# Patient Record
Sex: Male | Born: 1995 | Race: White | Hispanic: No | Marital: Single | Smoking: Current every day smoker
Health system: Southern US, Community
[De-identification: ages and names within clinical notes are randomized; demographics above are authoritative.]

## PROBLEM LIST (undated history)

## (undated) DIAGNOSIS — T7840XA Allergy, unspecified, initial encounter: Secondary | ICD-10-CM

## (undated) DIAGNOSIS — J45909 Unspecified asthma, uncomplicated: Secondary | ICD-10-CM

## (undated) HISTORY — DX: Allergy, unspecified, initial encounter: T78.40XA

## (undated) HISTORY — DX: Unspecified asthma, uncomplicated: J45.909

## (undated) NOTE — ED Provider Notes (Signed)
 Formatting of this note is different from the original. Emergency Department Provider Note.  Enc: 09/16/2024 10:31 AM Timothy Figueroa MRN: [id number] Acct# [id number] [facility] EMERGENCY  HPI   Timothy Figueroa is a 37 y.o. male who presents to the ED with CC: Back Pain  4 year old male presents with mother reporting back pain over the last day after going for a run pain is worse palpation flexion-extension took ibuprofen  without relief.  Denies any fevers chills urinary or stool incontinence saddle paresthesias or focal weakness urgency frequency.  Hematuria  REVIEW OF SYSTEMS   See HPI for pertinent positives and negatives.  PMH  PMH:  Past Medical History:  Diagnosis Date   Bipolar 1 disorder (CMS/HCC)    No past surgical history on file. Previous Medications   HYDROXYZINE  (ATARAX ) 25 MG TABLET    Take 1 tablet (25 mg total) by mouth every 8 (eight) hours as needed for itching   RISPERIDONE  (RISPERDAL ) 3 MG TABLET    Take 1 tablet (3 mg total) by mouth 2 (two) times a day   Social:  Social History   Tobacco Use   Smoking status: Never  Substance Use Topics   Alcohol use: Yes   Drug use: Not Currently   Family: No family history on file. Allergies: He is allergic to penicillins and sulfa antibiotics.  PHYSICAL EXAMINATION   Vital signs:Temperature: 98.8 F (37.1 C) Heart Rate: 94 Resp: 18 BP: 127/80 SpO2: 99 %  Const:  No acute distress Head:  Atraumatic Eyes:  Normal Conjunctiva ENT:  Normal External Ears, Nose and Mouth. [Moist mucous membranes] Neck:  Full range of motion.  No meningismus Resp:  Clear to auscultation bilaterally. Normal work of breathing Cardio:  Regular rate and rhythm, no murmurs. Skin well perfused Abd:  Soft, non-tender, non-distended. Normal bowel sounds. No rebound or guarding Skin:  No petechiae or rashes. Warm and dry Back:   Skin:  No bruising or rash Compartments:  Soft Motor:  Normal flexion and extension of bilateral  hip/knee/ankle/foot Sensation:  Intact to light touch throughout Bones:  No midline TTP  Ext:  No cyanosis, or edema Neuro:  Awake and alert Psych:  Normal Mood and Affect    Data  Lab results: Labs Reviewed - No data to display   X-RAY LS-SPINE 3V INTERPRETED BY ME:  Bones: No fracture Joints: No dislocation Foreign body: None Impression: Normal Lumbar Spine X-Ray  COURSE/MEDICAL DECISION MAKING  Course: Timothy Figueroa presented to the Emergency Department by walk-in for evaluation.  I reviewed the nursing notes.    Medical Decision Making:   Nursing Notes Reviewed Previous Medical Records requested via [facility]: Reviewed by me.  ________________________________________  EMERGENCY DEPARTMENT COURSE/ MEDICAL DECISION MAKING:  I examined the patient, evaluated and addressed patient's chief complaint. The patient was treated with Toradol dexamethasone   Differential Diagnosis:  Sprain strain spinal fracture, epidural hematoma, epidural abscess, unstable spinal pathology, emergent renal or aortic pathology, or spinal cord compression  Comorbidities impacting treatment:  Elevated BMI increased associated morbidity  Social Determinants of health that impact treatment or disposition: Poor access to follow up  MDM Data  Independent historian: yes mother bedside no associated fever  External documents reviewed: Personally reviewed ED visits to [facility] in 2025 labs imaging clinical reports reviewed  My X-ray interpretation: see above, personally interpreted no dislocation and agree w/ radiologist  My labwork interpretation:see above  Test considered but not ordered:  MRI however the patient has no  associated cord syndrome that is the  Discussion with: Personally discussed with family in regards to evaluation, treatment and disposition  Treatment and disposition  Prescription drug management considered:antibiotics, however no signs of bacterial  infection  Hospitalization consideredno  Shared decision making:yes  Code status: full code  MDM This is an otherwise healthy, well-appearing patient presenting with back pain. Patients does not have any high-risk features on history [Trauma, IVDA, cancer, significant weight loss or history of TB], and the patient has a normal neurologic exam without [fever, severe or progressive neurologic deficits, new or worsening urinary retention, urinary/stool incontinence or decreased perineal sensation]; therefore imaging was not indicated in the ED.   I doubt spinal fracture, epidural hematoma, epidural abscess, unstable spinal pathology, emergent renal or aortic pathology, or spinal cord compression.   Upon discharge, the patients pain was controlled, and they had improved mobility. I recommended both non-pharmacologic and non-opioid pharmacologic therapies as the patient does not have active cancer, nor do they currently qualify for palliative or end of life care.    Patient is aware that the purpose of this visit was to screen for an acute medical emergency requiring emergent stabilization. Chronic conditions, including malignancies, have not been ruled out. Patient is instructed to follow-up with a PCP and/or orthopedic surgeon as directed in discharge instructions for continued care and work-up.   Return precautions were discussed including worsening pain, new/worsening weakness/numbness, difficulty urinating, incontinence, or any other worsening/concerning symptoms.   If unable to arrange follow-up, they have been instructed to return to the ED for reassessment. Patient was given verbal and written discharge instructions and acknowledges understanding  On re-assessment, patient feels much better.   The patient understands that todays Emergency Department evaluation does not represent a comprehensive medical workup, and it is impossible to diagnose all possible illnesses from a single Emergency  Department visit. The patient verbalized understanding that it is absolutely necessary to have follow-up with regular primary care physician within 1-2 days for more detailed workup and continued exam. I explained the findings and plan to the patient, who expressed verbal understanding and agreed with plan for discharge and follow up. The patient was given after care instructions and welcomed to return to the ED for re-evaluation in 8-12 hours, especially for any new or worsening symptoms.  Patient's blood pressure was elevated (>120/80) but appears stable without evidence of end organ damage, malignant hypertension, hypertensive emergency or urgency.  The patient was counseled about the risks of hypertension and urged to pursue outpatient monitoring and therapy within a week with their primary care physician.  The patient was stable at the time of discharge.  This documentation was completed using Dragon microphone; therefore, please excuse any typos or misinterpretation of words that may sound similar to those intended.  Return to the Emergency Department for new or worsening symptoms. Final diagnoses:  Acute back pain   New Prescriptions   CYCLOBENZAPRINE (FLEXERIL) 10 MG TABLET    Take one tab qhs prn   IBUPROFEN  (ADVIL ,MOTRIN ) 600 MG TABLET    Take 1 tablet (600 mg total) by mouth every 6 (six) hours as needed for mild pain (1-3) for up to 15 doses      Diagnosis: 1. Acute back pain    Disposition:    Discharge  Portions of this chart may have been created with Dragon voice recognition software. Occasional wrong-word or ?sound-alike? substitutions may have occurred due to the inherent limitations of voice recognition software. Please read the chart carefully and  recognize, using context, where these substitutions have occurred.    Thao A Do, DO 09/16/24 1143  Electronically signed by Jaynie LABOR Do, DO at 09/16/2024 11:43 AM PST

## (undated) NOTE — ED Notes (Signed)
 Formatting of this note might be different from the original. D/C paperwork prepared and reviewed with pt regarding diagnosis: Back Pain. D/C instructions to F/U with PCP and report worsening symptoms with pt. RX sent to pharmacy of choice. Verbalized understanding and ambulated out with steady gait with all belongings.  Electronically signed by Mike Patch, RN at 09/16/2024 11:41 AM PST

## (undated) NOTE — ED Notes (Signed)
 Formatting of this note might be different from the original. PT BEING TAKEN TO RADIOLOGY Electronically signed by Mike Patch, RN at 09/16/2024 10:57 AM PST

## (undated) NOTE — Unmapped External Note (Signed)
 Formatting of this note might be different from the original. PT C/O RT SIDE MID BACK PAIN WITH RADIATION TO BILAT LEGS STS STARTED WHILE WAS RUNNING YESTERDAY. Electronically signed by Maryjane Ebbs, RN at 09/16/2024 10:28 AM PST

---
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
## 2014-10-27 ENCOUNTER — Ambulatory Visit (INDEPENDENT_AMBULATORY_CARE_PROVIDER_SITE_OTHER): Payer: BLUE CROSS/BLUE SHIELD

## 2014-10-27 ENCOUNTER — Ambulatory Visit (INDEPENDENT_AMBULATORY_CARE_PROVIDER_SITE_OTHER): Payer: BLUE CROSS/BLUE SHIELD | Admitting: Internal Medicine

## 2014-10-27 VITALS — BP 92/60 | HR 61 | Temp 97.6°F | Resp 20 | Ht 71.5 in | Wt 206.5 lb

## 2014-10-27 DIAGNOSIS — S93401A Sprain of unspecified ligament of right ankle, initial encounter: Secondary | ICD-10-CM

## 2014-10-27 DIAGNOSIS — M25571 Pain in right ankle and joints of right foot: Secondary | ICD-10-CM

## 2014-10-27 NOTE — Patient Instructions (Signed)

## 2014-10-27 NOTE — Progress Notes (Signed)
   Subjective:    Patient ID: Timothy Figueroa, male    DOB: 11-Jul-1996, 19 y.o.   MRN: [id number]  HPI Timothy Figueroa is a 19yo male new patient who presents with acute onset ankle pain. Location of pain is anterolateral right ankle and posterior portion of the distal right malleolus. He describes that he is walking his dog and suddenly fell in a hole, causing him to invert his right ankle. He says he fell to the ground after this and noticed immediate pain and swelling soon after. He was able to bear weight following the injury. He has taken ibuprofen 400 mg twice today with some relief in symptoms. Symptoms are aggravated with walking. Symptoms are relieved with medication and rest. He denies feeling any pop in the ankle. He denies any numbness, tingling, or weakness.  Past medical history, social history, medications, and allergies were reviewed and are up to date in the chart.  Allegies: PCN and Sulfa  Review of Systems 7 point review of systems was performed and was otherwise negative unless noted in the history of present illness.     Objective:   Physical Exam BP 92/60 mmHg  Pulse 61  Temp(Src) 97.6 F (36.4 C) (Oral)  Resp 20  Ht 5' 11.5" (1.816 m)  Wt 206 lb 8 oz (93.668 kg)  BMI 28.40 kg/m2  SpO2 98% GEN: The patient is well-developed well-nourished male and in no acute distress.  He is awake alert and oriented x3. SKIN: warm and well-perfused, no rash  EXTR: No lower extremity edema or calf tenderness Neuro: Strength 5/5 globally. Sensation intact throughout. No focal deficits. Vasc: +2 bilateral distal pulses. No edema.  MSK: Examination of the right ankle reveals full plantar and dorsiflexion with intact strength. He has tenderness to palpation over the ATFL. He has some localized swelling over the ATFL. He has minimal bruising and no ecchymoses. His peroneal and posterior tibialis tendons feel intact. He is also tender to palpation over the posterior distal third of the right  lateral malleolus. He has no tenderness at the base of the fifth metatarsal, navicular, or posterior portion of the medial malleolus. Negative Klieger test. Negative ankle squeeze test. Positive talar tilt test. He is neurovascularly intact distally.  X-rays Right Ankle: Preliminary read by Dr. Joellyn HaffPick-Jacobs, Sports Medicine Fellow: Normal bony alignment. No widening of ankle mortise. No acute fracture seen.     Assessment & Plan:   1. Grade I Right Ankle Sprain  -Preliminary X-rays negative for fracture or malalignment. -Rest, ice, elevation, NSAID as directed if needed -Ankle rehabilitation exercises given to work on strength and proprioception, early mobilization as symptoms permit. -Follow-up if needed.  Dr. Joellyn HaffPick-Jacobs, DO Sports Medicine Fellow  Discussed with Dr. Merla Richesoolittle.  Addendum: Final Radiologist Read: IMPRESSION: Apparent small osseous fragment adjacent to the distal fibula may reflect remote injury or possibly acute avulsion injury. No additional evidence of fracture.  I again reviewed the x-ray with Dr. Merla Richesoolittle, who agrees that the finding by the radiologist appears to be from an old injury. This would not change our current management of the patient's condition at this time. Follow-up as needed as before. If pain persists, would consider placing patient in a CAM walker boot.  Dr. Joellyn HaffPick-Jacobs, DO Sports Medicine Fellow Discussed with Dr. Merla Richesoolittle.

## 2015-02-06 ENCOUNTER — Emergency Department (HOSPITAL_COMMUNITY): Payer: BLUE CROSS/BLUE SHIELD

## 2015-02-06 ENCOUNTER — Encounter (HOSPITAL_COMMUNITY): Payer: Self-pay

## 2015-02-06 ENCOUNTER — Emergency Department (HOSPITAL_COMMUNITY)
Admission: EM | Admit: 2015-02-06 | Discharge: 2015-02-06 | Disposition: A | Discharge status: Home / Self Care | Payer: BLUE CROSS/BLUE SHIELD | Attending: Emergency Medicine | Admitting: Emergency Medicine

## 2015-02-06 DIAGNOSIS — S161XXA Strain of muscle, fascia and tendon at neck level, initial encounter: Secondary | ICD-10-CM | POA: Diagnosis not present

## 2015-02-06 DIAGNOSIS — Y9365 Activity, lacrosse and field hockey: Secondary | ICD-10-CM | POA: Diagnosis not present

## 2015-02-06 DIAGNOSIS — S060X0A Concussion without loss of consciousness, initial encounter: Secondary | ICD-10-CM | POA: Diagnosis not present

## 2015-02-06 DIAGNOSIS — W500XXA Accidental hit or strike by another person, initial encounter: Secondary | ICD-10-CM | POA: Insufficient documentation

## 2015-02-06 DIAGNOSIS — Y9239 Other specified sports and athletic area as the place of occurrence of the external cause: Secondary | ICD-10-CM | POA: Diagnosis not present

## 2015-02-06 DIAGNOSIS — Y998 Other external cause status: Secondary | ICD-10-CM | POA: Insufficient documentation

## 2015-02-06 DIAGNOSIS — J45909 Unspecified asthma, uncomplicated: Secondary | ICD-10-CM | POA: Diagnosis not present

## 2015-02-06 DIAGNOSIS — Z880 Allergy status to penicillin: Secondary | ICD-10-CM | POA: Diagnosis not present

## 2015-02-06 DIAGNOSIS — S0990XA Unspecified injury of head, initial encounter: Secondary | ICD-10-CM | POA: Diagnosis present

## 2015-02-06 MED ORDER — IBUPROFEN 800 MG PO TABS
800.0000 mg | ORAL_TABLET | Freq: Once | ORAL | Status: AC
  Administered 2015-02-06: 800 mg via ORAL
  Filled 2015-02-06: qty 1

## 2015-02-06 NOTE — Discharge Instructions (Signed)
Concussion A concussion, or closed-head injury, is a brain injury caused by a direct blow to the head or by a quick and sudden movement (jolt) of the head or neck. Concussions are usually not life-threatening. Even so, the effects of a concussion can be serious. If you have had a concussion before, you are more likely to experience concussion-like symptoms after a direct blow to the head.  CAUSES  Direct blow to the head, such as from running into another player during a soccer game, being hit in a fight, or hitting your head on a hard surface.  A jolt of the head or neck that causes the brain to move back and forth inside the skull, such as in a car crash. SIGNS AND SYMPTOMS The signs of a concussion can be hard to notice. Early on, they may be missed by you, family members, and health care providers. You may look fine but act or feel differently. Symptoms are usually temporary, but they may last for days, weeks, or even longer. Some symptoms may appear right away while others may not show up for hours or days. Every head injury is different. Symptoms include:  Mild to moderate headaches that will not go away.  A feeling of pressure inside your head.  Having more trouble than usual:  Learning or remembering things you have heard.  Answering questions.  Paying attention or concentrating.  Organizing daily tasks.  Making decisions and solving problems.  Slowness in thinking, acting or reacting, speaking, or reading.  Getting lost or being easily confused.  Feeling tired all the time or lacking energy (fatigued).  Feeling drowsy.  Sleep disturbances.  Sleeping more than usual.  Sleeping less than usual.  Trouble falling asleep.  Trouble sleeping (insomnia).  Loss of balance or feeling lightheaded or dizzy.  Nausea or vomiting.  Numbness or tingling.  Increased sensitivity to:  Sounds.  Lights.  Distractions.  Vision problems or eyes that tire  easily.  Diminished sense of taste or smell.  Ringing in the ears.  Mood changes such as feeling sad or anxious.  Becoming easily irritated or angry for little or no reason.  Lack of motivation.  Seeing or hearing things other people do not see or hear (hallucinations). DIAGNOSIS Your health care provider can usually diagnose a concussion based on a description of your injury and symptoms. He or she will ask whether you passed out (lost consciousness) and whether you are having trouble remembering events that happened right before and during your injury. Your evaluation might include:  A brain scan to look for signs of injury to the brain. Even if the test shows no injury, you may still have a concussion.  Blood tests to be sure other problems are not present. TREATMENT  Concussions are usually treated in an emergency department, in urgent care, or at a clinic. You may need to stay in the hospital overnight for further treatment.  Tell your health care provider if you are taking any medicines, including prescription medicines, over-the-counter medicines, and natural remedies. Some medicines, such as blood thinners (anticoagulants) and aspirin, may increase the chance of complications. Also tell your health care provider whether you have had alcohol or are taking illegal drugs. This information may affect treatment.  Your health care provider will send you home with important instructions to follow.  How fast you will recover from a concussion depends on many factors. These factors include how severe your concussion is, what part of your brain was injured, your  age, and how healthy you were before the concussion. °· Most people with mild injuries recover fully. Recovery can take time. In general, recovery is slower in older persons. Also, persons who have had a concussion in the past or have other medical problems may find that it takes longer to recover from their current injury. °HOME  CARE INSTRUCTIONS °General Instructions °· Carefully follow the directions your health care provider gave you. °· Only take over-the-counter or prescription medicines for pain, discomfort, or fever as directed by your health care provider. °· Take only those medicines that your health care provider has approved. °· Do not drink alcohol until your health care provider says you are well enough to do so. Alcohol and certain other drugs may slow your recovery and can put you at risk of further injury. °· If it is harder than usual to remember things, write them down. °· If you are easily distracted, try to do one thing at a time. For example, do not try to watch TV while fixing dinner. °· Talk with family members or close friends when making important decisions. °· Keep all follow-up appointments. Repeated evaluation of your symptoms is recommended for your recovery. °· Watch your symptoms and tell others to do the same. Complications sometimes occur after a concussion. Older adults with a brain injury may have a higher risk of serious complications, such as a blood clot on the brain. °· Tell your teachers, school nurse, school counselor, coach, athletic trainer, or work manager about your injury, symptoms, and restrictions. Tell them about what you can or cannot do. They should watch for: °¨ Increased problems with attention or concentration. °¨ Increased difficulty remembering or learning new information. °¨ Increased time needed to complete tasks or assignments. °¨ Increased irritability or decreased ability to cope with stress. °¨ Increased symptoms. °· Rest. Rest helps the brain to heal. Make sure you: °¨ Get plenty of sleep at night. Avoid staying up late at night. °¨ Keep the same bedtime hours on weekends and weekdays. °¨ Rest during the day. Take daytime naps or rest breaks when you feel tired. °· Limit activities that require a lot of thought or concentration. These include: °¨ Doing homework or job-related  work. °¨ Watching TV. °¨ Working on the computer. °· Avoid any situation where there is potential for another head injury (football, hockey, soccer, basketball, martial arts, downhill snow sports and horseback riding). Your condition will get worse every time you experience a concussion. You should avoid these activities until you are evaluated by the appropriate follow-up health care providers. °Returning To Your Regular Activities °You will need to return to your normal activities slowly, not all at once. You must give your body and brain enough time for recovery. °· Do not return to sports or other athletic activities until your health care provider tells you it is safe to do so. °· Ask your health care provider when you can drive, ride a bicycle, or operate heavy machinery. Your ability to react may be slower after a brain injury. Never do these activities if you are dizzy. °· Ask your health care provider about when you can return to work or school. °Preventing Another Concussion °It is very important to avoid another brain injury, especially before you have recovered. In rare cases, another injury can lead to permanent brain damage, brain swelling, or death. The risk of this is greatest during the first 7-10 days after a head injury. Avoid injuries by: °· Wearing a seat   belt when riding in a car.  Drinking alcohol only in moderation.  Wearing a helmet when biking, skiing, skateboarding, skating, or doing similar activities.  Avoiding activities that could lead to a second concussion, such as contact or recreational sports, until your health care provider says it is okay.  Taking safety measures in your home.  Remove clutter and tripping hazards from floors and stairways.  Use grab bars in bathrooms and handrails by stairs.  Place non-slip mats on floors and in bathtubs.  Improve lighting in dim areas. SEEK MEDICAL CARE IF:  You have increased problems paying attention or  concentrating.  You have increased difficulty remembering or learning new information.  You need more time to complete tasks or assignments than before.  You have increased irritability or decreased ability to cope with stress.  You have more symptoms than before. Seek medical care if you have any of the following symptoms for more than 2 weeks after your injury:  Lasting (chronic) headaches.  Dizziness or balance problems.  Nausea.  Vision problems.  Increased sensitivity to noise or light.  Depression or mood swings.  Anxiety or irritability.  Memory problems.  Difficulty concentrating or paying attention.  Sleep problems.  Feeling tired all the time. SEEK IMMEDIATE MEDICAL CARE IF:  You have severe or worsening headaches. These may be a sign of a blood clot in the brain.  You have weakness (even if only in one hand, leg, or part of the face).  You have numbness.  You have decreased coordination.  You vomit repeatedly.  You have increased sleepiness.  One pupil is larger than the other.  You have convulsions.  You have slurred speech.  You have increased confusion. This may be a sign of a blood clot in the brain.  You have increased restlessness, agitation, or irritability.  You are unable to recognize people or places.  You have neck pain.  It is difficult to wake you up.  You have unusual behavior changes.  You lose consciousness. MAKE SURE YOU:  Understand these instructions.  Will watch your condition.  Will get help right away if you are not doing well or get worse. Document Released: 12/30/2003 Document Revised: 10/14/2013 Document Reviewed: 05/01/2013 [facility] Patient Information 2015 [place], [place]. This information is not intended to replace advice given to you by your health care provider. Make sure you discuss any questions you have with your health care provider.  Cervical Sprain A cervical sprain is an injury in the neck in  which the strong, fibrous tissues (ligaments) that connect your neck bones stretch or tear. Cervical sprains can range from mild to severe. Severe cervical sprains can cause the neck vertebrae to be unstable. This can lead to damage of the spinal cord and can result in serious nervous system problems. The amount of time it takes for a cervical sprain to get better depends on the cause and extent of the injury. Most cervical sprains heal in 1 to 3 weeks. CAUSES  Severe cervical sprains may be caused by:   Contact sport injuries (such as from football, rugby, wrestling, hockey, auto racing, gymnastics, diving, martial arts, or boxing).   Motor vehicle collisions.   Whiplash injuries. This is an injury from a sudden forward and backward whipping movement of the head and neck.  Falls.  Mild cervical sprains may be caused by:   Being in an awkward position, such as while cradling a telephone between your ear and shoulder.   Sitting in a  chair that does not offer proper support.   Working at a poorly [employer] executivedesigned computer station.   Looking up or down for long periods of time.  SYMPTOMS   Pain, soreness, stiffness, or a burning sensation in the front, back, or sides of the neck. This discomfort may develop immediately after the injury or slowly, 24 hours or more after the injury.   Pain or tenderness directly in the middle of the back of the neck.   Shoulder or upper back pain.   Limited ability to move the neck.   Headache.   Dizziness.   Weakness, numbness, or tingling in the hands or arms.   Muscle spasms.   Difficulty swallowing or chewing.   Tenderness and swelling of the neck.  DIAGNOSIS  Most of the time your health care provider can diagnose a cervical sprain by taking your history and doing a physical exam. Your health care provider will ask about previous neck injuries and any known neck problems, such as arthritis in the neck. X-rays may be taken to find  out if there are any other problems, such as with the bones of the neck. Other tests, such as a CT scan or MRI, may also be needed.  TREATMENT  Treatment depends on the severity of the cervical sprain. Mild sprains can be treated with rest, keeping the neck in place (immobilization), and pain medicines. Severe cervical sprains are immediately immobilized. Further treatment is done to help with pain, muscle spasms, and other symptoms and may include:  Medicines, such as pain relievers, numbing medicines, or muscle relaxants.   Physical therapy. This may involve stretching exercises, strengthening exercises, and posture training. Exercises and improved posture can help stabilize the neck, strengthen muscles, and help stop symptoms from returning.  HOME CARE INSTRUCTIONS   Put ice on the injured area.   Put ice in a plastic bag.   Place a towel between your skin and the bag.   Leave the ice on for 15-20 minutes, 3-4 times a day.   If your injury was severe, you may have been given a cervical collar to wear. A cervical collar is a two-piece collar designed to keep your neck from moving while it heals.  Do not remove the collar unless instructed by your health care provider.  If you have long hair, keep it outside of the collar.  Ask your health care provider before making any adjustments to your collar. Minor adjustments may be required over time to improve comfort and reduce pressure on your chin or on the back of your head.  Ifyou are allowed to remove the collar for cleaning or bathing, follow your health care provider's instructions on how to do so safely.  Keep your collar clean by wiping it with mild soap and water and drying it completely. If the collar you have been given includes removable pads, remove them every 1-2 days and hand wash them with soap and water. Allow them to air dry. They should be completely dry before you wear them in the collar.  If you are allowed to  remove the collar for cleaning and bathing, wash and dry the skin of your neck. Check your skin for irritation or sores. If you see any, tell your health care provider.  Do not drive while wearing the collar.   Only take over-the-counter or prescription medicines for pain, discomfort, or fever as directed by your health care provider.   Keep all follow-up appointments as directed by your health care  provider.   Keep all physical therapy appointments as directed by your health care provider.   Make any needed adjustments to your workstation to promote good posture.   Avoid positions and activities that make your symptoms worse.   Warm up and stretch before being active to help prevent problems.  SEEK MEDICAL CARE IF:   Your pain is not controlled with medicine.   You are unable to decrease your pain medicine over time as planned.   Your activity level is not improving as expected.  SEEK IMMEDIATE MEDICAL CARE IF:   You develop any bleeding.  You develop stomach upset.  You have signs of an allergic reaction to your medicine.   Your symptoms get worse.   You develop new, unexplained symptoms.   You have numbness, tingling, weakness, or paralysis in any part of your body.  MAKE SURE YOU:   Understand these instructions.  Will watch your condition.  Will get help right away if you are not doing well or get worse. Document Released: 08/06/2007 Document Revised: 10/14/2013 Document Reviewed: 04/16/2013 [facility] Patient Information 2015 [place], [place]. This information is not intended to replace advice given to you by your health care provider. Make sure you discuss any questions you have with your health care provider.

## 2015-02-06 NOTE — ED Provider Notes (Signed)
CSN: [id number]     Arrival date & time 02/06/15  1815 History   First MD Initiated Contact with Patient 02/06/15 2008     Chief Complaint  Patient presents with  . Head Injury    Patient is a 19 y.o. male presenting with head injury. The history is provided by the patient. No language interpreter was used.  Head Injury  Mr. Innocent presents for evaluation of head injury. He was playing lacrosse around 5 PM. He was going for a ball and struck by another player. He was wearing a helmet. He is not amnestic to the event and has no loss of consciousness and no vomiting. Reports a mild headache rated 2-3 out of 10. He had nausea for about 10 minutes and felt not like himself for a while. This is since resolved. He does have some pain throughout his neck. He does have some chronic neck pain but it is more intense than usual. He denies any numbness, weakness, vomiting. He has no medical problems and takes no medications. Symptoms are moderate, constant.  Past Medical History  Diagnosis Date  . Allergy   . Asthma    History reviewed. No pertinent past surgical history. Family History  Problem Relation Age of Onset  . Hypertension Father   . Cancer Paternal Grandfather    History  Substance Use Topics  . Smoking status: Never Smoker   . Smokeless tobacco: Current User    Types: Chew  . Alcohol Use: No    Review of Systems  All other systems reviewed and are negative.     Allergies  Penicillins and Sulfa antibiotics  Home Medications   Prior to Admission medications   Not on File   BP 115/57 mmHg  Pulse 64  Temp(Src) 97.9 F (36.6 C)  Resp 20  Ht 6' (1.829 m)  Wt 206 lb 8 oz (93.668 kg)  BMI 28.00 kg/m2  SpO2 97% Physical Exam  Constitutional: He is oriented to person, place, and time. He appears well-developed and well-nourished.  HENT:  Head: Normocephalic and atraumatic.  Right Ear: External ear normal.  Left Ear: External ear normal.  Mouth/Throat: Oropharynx is  clear and moist.  Eyes: EOM are normal. Pupils are equal, round, and reactive to light.  Neck:  Mild tenderness to palpation over lower c spine.    Cardiovascular: Normal rate and regular rhythm.   No murmur heard. Pulmonary/Chest: Effort normal and breath sounds normal. No respiratory distress.  Abdominal: Soft. There is no tenderness. There is no rebound and no guarding.  Musculoskeletal: He exhibits no edema or tenderness.  Neurological: He is alert and oriented to person, place, and time. No cranial nerve deficit. Coordination normal.  Skin: Skin is warm and dry.  Psychiatric: He has a normal mood and affect. His behavior is normal.  Nursing note and vitals reviewed.   ED Course  Procedures (including critical care time) Labs Review Labs Reviewed - No data to display  Imaging Review Dg Cervical Spine Complete  02/06/2015   CLINICAL DATA:  Head injury. Hyperextended neck while playing lacrosse, now with left-sided neck pain radiating into the back.  EXAM: CERVICAL SPINE  4+ VIEWS  COMPARISON:  None.  FINDINGS: Cervical spine alignment is maintained. Vertebral body heights and intervertebral disc spaces are preserved. The dens is intact. Posterior elements appear well-aligned. There is no evidence of fracture. No prevertebral soft tissue edema.  IMPRESSION: No acute bony abnormality of the cervical spine. No fracture or subluxation.  Electronically Signed   By: Rubye OaksMelanie  Ehinger M.D.   On: 02/06/2015 21:05     EKG Interpretation None      MDM   Final diagnoses:  Concussion, without loss of consciousness, initial encounter  Cervical strain, initial encounter    Patient here for evaluation of neck discomfort and headache following head injury. Patient is neurologically intact, GCS 15. Patient does not need a CT head be some [place] head CT criteria, he is very low risk for serious intracranial abnormality. Patient does have some mild C-spine tenderness, plain films are negative  for acute fracture. Discussed with patient cervical strain and concussion. Discussed with patient that he is unable to play until he is symptom free for a minimum of 1 week. Discuss PCP follow-up as well as return precautions.    Tilden FossaElizabeth Rechy Bost, MD 02/06/15 2131

## 2015-02-06 NOTE — ED Notes (Addendum)
Onset today pt playing lacrosse, bending over and was hit by another person, not sure how he was hit.  No LOC. Pt c/o neck pain and headache.  Pt had helmet on.

## 2019-04-01 ENCOUNTER — Ambulatory Visit: Payer: Self-pay | Admitting: Nurse Practitioner

## 2019-04-01 ENCOUNTER — Other Ambulatory Visit: Payer: Self-pay

## 2019-04-01 VITALS — BP 95/65 | HR 61 | Temp 97.6°F | Resp 16 | Ht 72.0 in | Wt 200.2 lb

## 2019-04-01 DIAGNOSIS — Z23 Encounter for immunization: Secondary | ICD-10-CM

## 2019-04-01 DIAGNOSIS — Z0000 Encounter for general adult medical examination without abnormal findings: Secondary | ICD-10-CM

## 2019-04-01 NOTE — Progress Notes (Signed)
Pt presents here today for visit to receive Tdap vaccine. Allergies reviewed, vaccine given, vaccine information statement provided, tolerated well.  Patient presents for PPD placement for school Denies previous positive TB test  Denies known exposure to TB   Tuberculin skin test applied to left ventral forearm.  Patient informed to [facility] in [facility]72 hours for PPD read. Vaccine Information Statement provided to patient.

## 2019-04-01 NOTE — Patient Instructions (Signed)
Health Maintenance, Male  - Follow up as needed.   A healthy lifestyle and preventive care is important for your health and wellness. Ask your health care provider about what schedule of regular examinations is right for you. What should I know about weight and diet? Eat a Healthy Diet  Eat plenty of vegetables, fruits, whole grains, low-fat dairy products, and lean protein.  Do not eat a lot of foods high in solid fats, added sugars, or salt.  Maintain a Healthy Weight Regular exercise can help you achieve or maintain a healthy weight. You should:  Do at least 150 minutes of exercise each week. The exercise should increase your heart rate and make you sweat (moderate-intensity exercise).  Do strength-training exercises at least twice a week. Watch Your Levels of Cholesterol and Blood Lipids  Have your blood tested for lipids and cholesterol every 5 years starting at 24 years of age. If you are at high risk for heart disease, you should start having your blood tested when you are 24 years old. You may need to have your cholesterol levels checked more often if: ? Your lipid or cholesterol levels are high. ? You are older than 24 years of age. ? You are at high risk for heart disease. What should I know about cancer screening? Many types of cancers can be detected early and may often be prevented. Lung Cancer  You should be screened every year for lung cancer if: ? You are a current smoker who has smoked for at least 30 years. ? You are a former smoker who has quit within the past 15 years.  Talk to your health care provider about your screening options, when you should start screening, and how often you should be screened. Colorectal Cancer  Routine colorectal cancer screening usually begins at 24 years of age and should be repeated every 5-10 years until you are 24 years old. You may need to be screened more often if early forms of precancerous polyps or small growths are found.  Your health care provider may recommend screening at an earlier age if you have risk factors for colon cancer.  Your health care provider may recommend using home test kits to check for hidden blood in the stool.  A small camera at the end of a tube can be used to examine your colon (sigmoidoscopy or colonoscopy). This checks for the earliest forms of colorectal cancer. Prostate and Testicular Cancer  Depending on your age and overall health, your health care provider may do certain tests to screen for prostate and testicular cancer.  Talk to your health care provider about any symptoms or concerns you have about testicular or prostate cancer. Skin Cancer  Check your skin from head to toe regularly.  Tell your health care provider about any new moles or changes in moles, especially if: ? There is a change in a mole's size, shape, or color. ? You have a mole that is larger than a pencil eraser.  Always use sunscreen. Apply sunscreen liberally and repeat throughout the day.  Protect yourself by wearing long sleeves, pants, a wide-brimmed hat, and sunglasses when outside. What should I know about heart disease, diabetes, and high blood pressure?  If you are 61-63 years of age, have your blood pressure checked every 3-5 years. If you are 74 years of age or older, have your blood pressure checked every year. You should have your blood pressure measured twice-once when you are at a hospital or clinic, and  once when you are not at a hospital or clinic. Record the average of the two measurements. To check your blood pressure when you are not at a hospital or clinic, you can use: ? An automated blood pressure machine at a pharmacy. ? A home blood pressure monitor.  Talk to your health care provider about your target blood pressure.  If you are between 41-22 years old, ask your health care provider if you should take aspirin to prevent heart disease.  Have regular diabetes screenings by checking  your fasting blood sugar level. ? If you are at a normal weight and have a low risk for diabetes, have this test once every three years after the age of 70. ? If you are overweight and have a high risk for diabetes, consider being tested at a younger age or more often.  A one-time screening for abdominal aortic aneurysm (AAA) by ultrasound is recommended for men aged 32-75 years who are current or former smokers. What should I know about preventing infection? Hepatitis B If you have a higher risk for hepatitis B, you should be screened for this virus. Talk with your health care provider to find out if you are at risk for hepatitis B infection. Hepatitis C Blood testing is recommended for:  Everyone born from [birth year] through 1966.  Anyone with known risk factors for hepatitis C. Sexually Transmitted Diseases (STDs)  You should be screened each year for STDs including gonorrhea and chlamydia if: ? You are sexually active and are younger than 24 years of age. ? You are older than 24 years of age and your health care provider tells you that you are at risk for this type of infection. ? Your sexual activity has changed since you were last screened and you are at an increased risk for chlamydia or gonorrhea. Ask your health care provider if you are at risk.  Talk with your health care provider about whether you are at high risk of being infected with HIV. Your health care provider may recommend a prescription medicine to help prevent HIV infection. What else can I do?  Schedule regular health, dental, and eye exams.  Stay current with your vaccines (immunizations).  Do not use any tobacco products, such as cigarettes, chewing tobacco, and e-cigarettes. If you need help quitting, ask your health care provider.  Limit alcohol intake to no more than 2 drinks per day. One drink equals 12 ounces of beer, 5 ounces of wine, or 1 ounces of hard liquor.  Do not use street drugs.  Do not share  needles.  Ask your health care provider for help if you need support or information about quitting drugs.  Tell your health care provider if you often feel depressed.  Tell your health care provider if you have ever been abused or do not feel safe at home. This information is not intended to replace advice given to you by your health care provider. Make sure you discuss any questions you have with your health care provider. Document Released: 04/06/2008 Document Revised: 06/07/2016 Document Reviewed: 07/13/2015 Elsevier Interactive Patient Education  2019 [place] 18-39 Years, Male Preventive care refers to lifestyle choices and visits with your health care provider that can promote health and wellness. What does preventive care include?   A yearly physical exam. This is also called an annual well check.  Dental exams once or twice a year.  Routine eye exams. Ask your health care provider how often you should  have your eyes checked.  Personal lifestyle choices, including: ? Daily care of your teeth and gums. ? Regular physical activity. ? Eating a healthy diet. ? Avoiding tobacco and drug use. ? Limiting alcohol use. ? Practicing safe sex. What happens during an annual well check? The services and screenings done by your health care provider during your annual well check will depend on your age, overall health, lifestyle risk factors, and family history of disease. Counseling Your health care provider may ask you questions about your:  Alcohol use.  Tobacco use.  Drug use.  Emotional well-being.  Home and relationship well-being.  Sexual activity.  Eating habits.  Work and work Statistician. Screening You may have the following tests or measurements:  Height, weight, and BMI.  Blood pressure.  Lipid and cholesterol levels. These may be checked every 5 years starting at age 53.  Diabetes screening. This is done by checking your blood  sugar (glucose) after you have not eaten for a while (fasting).  Skin check.  Hepatitis C blood test.  Hepatitis B blood test.  Sexually transmitted disease (STD) testing. Discuss your test results, treatment options, and if necessary, the need for more tests with your health care provider. Vaccines Your health care provider may recommend certain vaccines, such as:  Influenza vaccine. This is recommended every year.  Tetanus, diphtheria, and acellular pertussis (Tdap, Td) vaccine. You may need a Td booster every 10 years.  Varicella vaccine. You may need this if you have not been vaccinated.  HPV vaccine. If you are 76 or younger, you may need three doses over 6 months.  Measles, mumps, and rubella (MMR) vaccine. You may need at least one dose of MMR.You may also need a second dose.  Pneumococcal 13-valent conjugate (PCV13) vaccine. You may need this if you have certain conditions and have not been vaccinated.  Pneumococcal polysaccharide (PPSV23) vaccine. You may need one or two doses if you smoke cigarettes or if you have certain conditions.  Meningococcal vaccine. One dose is recommended if you are age 65-21 years and a first-year college student living in a residence hall, or if you have one of several medical conditions. You may also need additional booster doses.  Hepatitis A vaccine. You may need this if you have certain conditions or if you travel or work in places where you may be exposed to hepatitis A.  Hepatitis B vaccine. You may need this if you have certain conditions or if you travel or work in places where you may be exposed to hepatitis B.  Haemophilus influenzae type b (Hib) vaccine. You may need this if you have certain risk factors. Talk to your health care provider about which screenings and vaccines you need and how often you need them. This information is not intended to replace advice given to you by your health care provider. Make sure you discuss any  questions you have with your health care provider. Document Released: 12/05/2001 Document Revised: 05/22/2017 Document Reviewed: 08/10/2015 Elsevier Interactive Patient Education  2019 [employer].

## 2019-04-01 NOTE — Progress Notes (Signed)
Subjective:  Timothy Figueroa is a 23 y.o. male who presents for basic physical exam. 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  Patient denies any current health related concerns today. The patient admits to a history of alchohol abuse, depression and anxiety.  The patient informs he currently is not under the care of a physician for these diagnoses at this time.  The patient denies a history of heart disease, lung disease, liver disease, kidney disease, diabetes, hypertension, or seizures.  Patient does have a history of asthma and seasonal allergies per his past medical history.  The patient informs he currently does not take any medications, patient is allergic to penicillin and sulfa drugs.  The patient has provided a copy of his immunization records which appear to be up-to-date.  The patient denies any past surgical history or recent hospitalizations.  In review of the patient's chart, patient was seen in the emergency department of [facility] on October 31, 2018 for alcohol abuse.  The patient informs he lives at home with his parents.  The patient has 1 brother.  The patient's father is 63 years old, he is alive, has a history of hypertension.  The patient's mother is 60 years old, alive, and has no past medical history.  The patient informs his brother is 68 years old and is healthy.  The patient informs he tries to eat healthy and exercises about 3-4 times per week.  The patient informs that he does drink alcohol occasionally, with his last consumption 1 month ago, patient does have a past medical history of alcohol abuse.  Patient denies smoking, or use of recreational drugs.    Past Medical History:  Diagnosis Date  . Allergy   . Asthma     No past surgical history on file.  Social History   Tobacco Use  . Smoking status: Never Smoker  . Smokeless tobacco: Current User    Types: Chew  Substance Use Topics  . Alcohol use: No    Alcohol/week: 0.0 standard  drinks  . Drug use: No    Allergies  Allergen Reactions  . Penicillins Hives  . Sulfa Antibiotics Hives    No current outpatient medications on file.   No current facility-administered medications for this visit.     Review of Systems  Constitutional: Negative.   HENT: Negative.   Eyes: Negative.   Respiratory: Negative.   Cardiovascular: Negative.   Gastrointestinal: Negative.   Musculoskeletal: Negative.   Skin: Negative.   Neurological: Negative.   Endo/Heme/Allergies: Positive for environmental allergies.  Psychiatric/Behavioral: Positive for depression ( History of depression and anxiety) and substance abuse ( Alcohol).    Objective: Blood pressure 95/65, pulse 61, temperature 97.6 F (36.4 C), temperature source Oral, resp. rate 16, height 6' (1.829 m), weight 200 lb 3.2 oz (90.8 kg), SpO2 98 %.  Physical Exam Vitals signs reviewed.  Constitutional:      General: He is not in acute distress. HENT:     Head: Normocephalic.     Right Ear: Tympanic membrane, ear canal and external ear normal.     Left Ear: Tympanic membrane, ear canal and external ear normal.     Nose: Mucosal edema present. No congestion or rhinorrhea.     Right Turbinates: Enlarged and swollen.     Left Turbinates: Enlarged and swollen.     Right Sinus: No maxillary sinus tenderness or frontal sinus tenderness.     Left Sinus: No maxillary sinus tenderness  or frontal sinus tenderness.     Mouth/Throat:     Lips: Pink.     Mouth: Mucous membranes are moist.     Pharynx: Oropharynx is clear. Uvula midline. No pharyngeal swelling, oropharyngeal exudate or posterior oropharyngeal erythema.     Tonsils: No tonsillar exudate. 0 on the right. 0 on the left.  Eyes:     Conjunctiva/sclera: Conjunctivae normal.     Pupils: Pupils are equal, round, and reactive to light.  Neck:     Musculoskeletal: Normal range of motion and neck supple.  Cardiovascular:     Rate and Rhythm: Normal rate and regular  rhythm.     Pulses: Normal pulses.     Heart sounds: Normal heart sounds.  Pulmonary:     Effort: Pulmonary effort is normal. No respiratory distress.     Breath sounds: Normal breath sounds. No stridor. No wheezing or rhonchi.  Abdominal:     General: Bowel sounds are normal.     Palpations: Abdomen is soft.     Tenderness: There is no abdominal tenderness. There is no right CVA tenderness or left CVA tenderness.  Musculoskeletal: Normal range of motion.  Lymphadenopathy:     Cervical: No cervical adenopathy.  Skin:    General: Skin is warm and dry.     Findings: No rash.  Neurological:     General: No focal deficit present.     Mental Status: He is alert and oriented to person, place, and time.     Cranial Nerves: No cranial nerve deficit.  Psychiatric:        Mood and Affect: Mood normal.        Behavior: Behavior normal.     Assessment:  basic physical exam    Plan:   Exam findings, diagnosis etiology and medication use and indications reviewed with patient. Follow- Up and discharge instructions provided. No emergent/urgent issues found on exam.  Paperwork was completed, and a copy will be scanned into the patient's chart.  The patient was instructed to return to our office within 48 to 72 hours to have his PPD test read.  Patient education was provided for health maintenance and health prevention. Patient verbalized understanding of information provided and agrees with plan of care (POC), all questions answered. The patient is advised to call or return to clinic if condition does not see an improvement in symptoms, or to seek the care of the closest emergency department if condition worsens with the above plan.    1. Need for Tdap vaccination  - PPD -Return in 48-72 hours for PPD read.  2. Routine physical examination  -Follow up as needed.

## 2019-04-03 LAB — TB SKIN TEST
Induration: NEGATIVE mm
TB Skin Test: NEGATIVE

## 2020-01-16 ENCOUNTER — Ambulatory Visit: Payer: BLUE CROSS/BLUE SHIELD | Attending: Internal Medicine

## 2020-01-16 DIAGNOSIS — Z23 Encounter for immunization: Secondary | ICD-10-CM

## 2020-01-16 NOTE — Progress Notes (Signed)
   Covid-19 Vaccination Clinic  Name:  Timothy Figueroa    MRN: [id number] DOB: 1996/06/28  01/16/2020  Mr. Strange was observed post Covid-19 immunization for 15 minutes without incident. He was provided with Vaccine Information Sheet and instruction to access the V-Safe system.   Mr. Malinak was instructed to call 911 with any severe reactions post vaccine: Marland Kitchen Difficulty breathing  . Swelling of face and throat  . A fast heartbeat  . A bad rash all over body  . Dizziness and weakness   Immunizations Administered    Name Date Dose VIS Date Route   Pfizer COVID-19 Vaccine 01/16/2020  9:36 AM 0.3 mL 10/03/2019 Intramuscular   Manufacturer: [employer], [employer]   Lot: [id number]   NDC: [address]-3

## 2020-02-11 ENCOUNTER — Ambulatory Visit: Payer: 59 | Attending: Internal Medicine

## 2020-02-11 DIAGNOSIS — Z23 Encounter for immunization: Secondary | ICD-10-CM

## 2020-02-11 NOTE — Progress Notes (Signed)
   Covid-19 Vaccination Clinic  Name:  CATCHER DEHOYOS    MRN: [id number] DOB: [birth year]  02/11/2020  Mr. Plantz was observed post Covid-19 immunization for 15 minutes without incident. He was provided with Vaccine Information Sheet and instruction to access the V-Safe system.   Mr. Granquist was instructed to call 911 with any severe reactions post vaccine: Marland Kitchen Difficulty breathing  . Swelling of face and throat  . A fast heartbeat  . A bad rash all over body  . Dizziness and weakness   Immunizations Administered    Name Date Dose VIS Date Route   Pfizer COVID-19 Vaccine 02/11/2020  2:17 PM 0.3 mL 12/17/2018 Intramuscular   Manufacturer: [employer], [employer]   Lot: [id number]   NDC: [address]-0

## 2021-06-05 ENCOUNTER — Emergency Department (HOSPITAL_COMMUNITY): Payer: BC Managed Care – PPO

## 2021-06-05 ENCOUNTER — Emergency Department (HOSPITAL_COMMUNITY)
Admission: EM | Admit: 2021-06-05 | Discharge: 2021-06-05 | Disposition: A | Discharge status: Home / Self Care | Payer: BC Managed Care – PPO | Attending: Emergency Medicine | Admitting: Emergency Medicine

## 2021-06-05 ENCOUNTER — Encounter (HOSPITAL_COMMUNITY): Payer: Self-pay

## 2021-06-05 DIAGNOSIS — M25561 Pain in right knee: Secondary | ICD-10-CM | POA: Insufficient documentation

## 2021-06-05 DIAGNOSIS — S61309A Unspecified open wound of unspecified finger with damage to nail, initial encounter: Secondary | ICD-10-CM | POA: Insufficient documentation

## 2021-06-05 DIAGNOSIS — S62634B Displaced fracture of distal phalanx of right ring finger, initial encounter for open fracture: Secondary | ICD-10-CM | POA: Insufficient documentation

## 2021-06-05 DIAGNOSIS — S6991XA Unspecified injury of right wrist, hand and finger(s), initial encounter: Secondary | ICD-10-CM | POA: Diagnosis present

## 2021-06-05 DIAGNOSIS — T07XXXA Unspecified multiple injuries, initial encounter: Secondary | ICD-10-CM | POA: Insufficient documentation

## 2021-06-05 LAB — PROTIME-INR
INR: 1 (ref 0.8–1.2)
Prothrombin Time: 12.8 seconds (ref 11.4–15.2)

## 2021-06-05 LAB — I-STAT CHEM 8, ED
BUN: 8 mg/dL (ref 6–20)
Calcium, Ion: 1.17 mmol/L (ref 1.15–1.40)
Chloride: 106 mmol/L (ref 98–111)
Creatinine, Ser: 0.9 mg/dL (ref 0.61–1.24)
Glucose, Bld: 94 mg/dL (ref 70–99)
HCT: 45 % (ref 39.0–52.0)
Hemoglobin: 15.3 g/dL (ref 13.0–17.0)
Potassium: 3.9 mmol/L (ref 3.5–5.1)
Sodium: 141 mmol/L (ref 135–145)
TCO2: 21 mmol/L — ABNORMAL LOW (ref 22–32)

## 2021-06-05 LAB — CBC
HCT: 43.6 % (ref 39.0–52.0)
Hemoglobin: 15.2 g/dL (ref 13.0–17.0)
MCH: 29.4 pg (ref 26.0–34.0)
MCHC: 34.9 g/dL (ref 30.0–36.0)
MCV: 84.3 fL (ref 80.0–100.0)
Platelets: 290 10*3/uL (ref 150–400)
RBC: 5.17 MIL/uL (ref 4.22–5.81)
RDW: 11.9 % (ref 11.5–15.5)
WBC: 14.1 10*3/uL — ABNORMAL HIGH (ref 4.0–10.5)
nRBC: 0 % (ref 0.0–0.2)

## 2021-06-05 LAB — URINALYSIS, ROUTINE W REFLEX MICROSCOPIC
Bilirubin Urine: NEGATIVE
Glucose, UA: NEGATIVE mg/dL
Hgb urine dipstick: NEGATIVE
Ketones, ur: NEGATIVE mg/dL
Leukocytes,Ua: NEGATIVE
Nitrite: NEGATIVE
Protein, ur: NEGATIVE mg/dL
Specific Gravity, Urine: 1.01 (ref 1.005–1.030)
pH: 6 (ref 5.0–8.0)

## 2021-06-05 LAB — COMPREHENSIVE METABOLIC PANEL
ALT: 41 U/L (ref 0–44)
AST: 44 U/L — ABNORMAL HIGH (ref 15–41)
Albumin: 4.7 g/dL (ref 3.5–5.0)
Alkaline Phosphatase: 44 U/L (ref 38–126)
Anion gap: 10 (ref 5–15)
BUN: 10 mg/dL (ref 6–20)
CO2: 23 mmol/L (ref 22–32)
Calcium: 9.5 mg/dL (ref 8.9–10.3)
Chloride: 107 mmol/L (ref 98–111)
Creatinine, Ser: 0.8 mg/dL (ref 0.61–1.24)
GFR, Estimated: >60 mL/min (ref >60)
Glucose, Bld: 94 mg/dL (ref 70–99)
Potassium: 3.8 mmol/L (ref 3.5–5.1)
Sodium: 140 mmol/L (ref 135–145)
Total Bilirubin: 0.7 mg/dL (ref 0.3–1.2)
Total Protein: 7.7 g/dL (ref 6.5–8.1)

## 2021-06-05 LAB — LACTIC ACID, PLASMA: Lactic Acid, Venous: 2 mmol/L (ref 0.5–1.9)

## 2021-06-05 LAB — SAMPLE TO BLOOD BANK

## 2021-06-05 LAB — ETHANOL: Alcohol, Ethyl (B): 85 mg/dL — ABNORMAL HIGH (ref <10)

## 2021-06-05 MED ORDER — SODIUM CHLORIDE 0.9 % IV BOLUS
1000.0000 mL | Freq: Once | INTRAVENOUS | Status: AC
  Administered 2021-06-05: 1000 mL via INTRAVENOUS

## 2021-06-05 MED ORDER — IOHEXOL 350 MG/ML SOLN
80.0000 mL | Freq: Once | INTRAVENOUS | Status: AC | PRN
  Administered 2021-06-05: 80 mL via INTRAVENOUS

## 2021-06-05 MED ORDER — FENTANYL CITRATE (PF) 100 MCG/2ML IJ SOLN
50.0000 ug | Freq: Once | INTRAMUSCULAR | Status: AC
  Administered 2021-06-05: 50 ug via INTRAVENOUS
  Filled 2021-06-05: qty 2

## 2021-06-05 MED ORDER — BACITRACIN ZINC 500 UNIT/GM EX OINT
TOPICAL_OINTMENT | Freq: Two times a day (BID) | CUTANEOUS | Status: DC
  Administered 2021-06-05: 1 via TOPICAL
  Filled 2021-06-05: qty 0.9

## 2021-06-05 MED ORDER — CEFAZOLIN SODIUM-DEXTROSE 1-4 GM/50ML-% IV SOLN
1.0000 g | Freq: Once | INTRAVENOUS | Status: AC
  Administered 2021-06-05: 1 g via INTRAVENOUS
  Filled 2021-06-05: qty 50

## 2021-06-05 MED ORDER — CEPHALEXIN 500 MG PO CAPS
500.0000 mg | ORAL_CAPSULE | Freq: Once | ORAL | Status: AC
  Administered 2021-06-05: 500 mg via ORAL
  Filled 2021-06-05: qty 1

## 2021-06-05 MED ORDER — CEPHALEXIN 500 MG PO CAPS: 500.0000 mg | ORAL_CAPSULE | Freq: Three times a day (TID) | ORAL | 0 refills | Status: AC

## 2021-06-05 NOTE — ED Notes (Signed)
X RAY at bedside 

## 2021-06-05 NOTE — ED Notes (Signed)
XRAy at bedside

## 2021-06-05 NOTE — ED Notes (Signed)
Patient had a c-collar on when he arrived to the room.  Collar is now laying on patient's lap.  When asked who took collar off, patient states it's been off for a while and that he took it off.  I educated patient on the important of leaving th collar on incase he had a neck injury.  He stated he does not have a neck injury.
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
## 2023-03-30 ENCOUNTER — Other Ambulatory Visit: Payer: Self-pay | Admitting: Family Medicine

## 2023-03-30 ENCOUNTER — Other Ambulatory Visit: Payer: Self-pay

## 2023-03-30 DIAGNOSIS — R7989 Other specified abnormal findings of blood chemistry: Secondary | ICD-10-CM

## 2023-05-04 ENCOUNTER — Ambulatory Visit
Admission: RE | Admit: 2023-05-04 | Discharge: 2023-05-04 | Disposition: A | Discharge status: Home / Self Care | Payer: Commercial Managed Care - HMO | Source: Ambulatory Visit | Attending: Family Medicine | Admitting: Family Medicine

## 2023-05-04 DIAGNOSIS — R7989 Other specified abnormal findings of blood chemistry: Secondary | ICD-10-CM

## 2023-05-11 ENCOUNTER — Ambulatory Visit
Admission: RE | Admit: 2023-05-11 | Discharge: 2023-05-11 | Disposition: A | Discharge status: Home / Self Care | Payer: Commercial Managed Care - HMO | Source: Ambulatory Visit | Attending: Family Medicine | Admitting: Family Medicine

## 2023-08-28 ENCOUNTER — Emergency Department (HOSPITAL_BASED_OUTPATIENT_CLINIC_OR_DEPARTMENT_OTHER)
Admission: EM | Admit: 2023-08-28 | Discharge: 2023-08-28 | Disposition: A | Discharge status: Home / Self Care | Payer: Commercial Managed Care - HMO | Attending: Emergency Medicine | Admitting: Emergency Medicine

## 2023-08-28 ENCOUNTER — Other Ambulatory Visit: Payer: Self-pay

## 2023-08-28 ENCOUNTER — Encounter (HOSPITAL_BASED_OUTPATIENT_CLINIC_OR_DEPARTMENT_OTHER): Payer: Self-pay

## 2023-08-28 DIAGNOSIS — E860 Dehydration: Secondary | ICD-10-CM | POA: Diagnosis not present

## 2023-08-28 DIAGNOSIS — F10929 Alcohol use, unspecified with intoxication, unspecified: Secondary | ICD-10-CM | POA: Diagnosis not present

## 2023-08-28 DIAGNOSIS — R251 Tremor, unspecified: Secondary | ICD-10-CM

## 2023-08-28 DIAGNOSIS — J45909 Unspecified asthma, uncomplicated: Secondary | ICD-10-CM | POA: Insufficient documentation

## 2023-08-28 DIAGNOSIS — Y906 Blood alcohol level of 120-199 mg/100 ml: Secondary | ICD-10-CM | POA: Diagnosis not present

## 2023-08-28 LAB — COMPREHENSIVE METABOLIC PANEL
ALT: 55 U/L — ABNORMAL HIGH (ref 0–44)
AST: 32 U/L (ref 15–41)
Albumin: 5.3 g/dL — ABNORMAL HIGH (ref 3.5–5.0)
Alkaline Phosphatase: 53 U/L (ref 38–126)
Anion gap: 23 — ABNORMAL HIGH (ref 5–15)
BUN: 11 mg/dL (ref 6–20)
CO2: 21 mmol/L — ABNORMAL LOW (ref 22–32)
Calcium: 10.1 mg/dL (ref 8.9–10.3)
Chloride: 96 mmol/L — ABNORMAL LOW (ref 98–111)
Creatinine, Ser: 1.12 mg/dL (ref 0.61–1.24)
GFR, Estimated: >60 mL/min (ref >60)
Glucose, Bld: 79 mg/dL (ref 70–99)
Potassium: 3.6 mmol/L (ref 3.5–5.1)
Sodium: 140 mmol/L (ref 135–145)
Total Bilirubin: 0.9 mg/dL (ref <1.2)
Total Protein: 7.7 g/dL (ref 6.5–8.1)

## 2023-08-28 LAB — ETHANOL: Alcohol, Ethyl (B): 195 mg/dL — ABNORMAL HIGH (ref <10)

## 2023-08-28 LAB — CBC
HCT: 46.9 % (ref 39.0–52.0)
Hemoglobin: 16.5 g/dL (ref 13.0–17.0)
MCH: 29.8 pg (ref 26.0–34.0)
MCHC: 35.2 g/dL (ref 30.0–36.0)
MCV: 84.8 fL (ref 80.0–100.0)
Platelets: 410 10*3/uL — ABNORMAL HIGH (ref 150–400)
RBC: 5.53 MIL/uL (ref 4.22–5.81)
RDW: 11.9 % (ref 11.5–15.5)
WBC: 12.3 10*3/uL — ABNORMAL HIGH (ref 4.0–10.5)
nRBC: 0 % (ref 0.0–0.2)

## 2023-08-28 MED ORDER — CHLORDIAZEPOXIDE HCL 25 MG PO CAPS: ORAL_CAPSULE | ORAL | 0 refills | Status: DC

## 2023-08-28 NOTE — ED Triage Notes (Signed)
Drinking heavily for the past 4-5 days. States he is having ETOH withdrawals C/o abdominal pain Nausea and shaky

## 2023-08-28 NOTE — Discharge Instructions (Signed)
You were evaluated in the Emergency Department and after careful evaluation, we did not find any emergent condition requiring admission or further testing in the hospital.  Your exam/testing today was overall reassuring.  Use the Librium medication to detox from alcohol as prescribed.  Plenty of fluids and rest.  Please return to the Emergency Department if you experience any worsening of your condition.  Thank you for allowing [place] to be a part of your care.

## 2023-08-28 NOTE — ED Provider Notes (Signed)
[facility] Emergency Department Provider Note MRN:  [id number]  Arrival date & time: 08/28/23     Chief Complaint   Alcohol Problem   History of Present Illness   Timothy Figueroa is a 27 y.o. year-old male with no pertinent past medical history presenting to the ED with chief complaint of alcohol problem.  Feels like he is withdrawing.  Has been in a repetitive cycle of drinking heavily and passing out for the past few days.  Thinks he drank about 3 hours ago and then fell asleep.  Feels shaky, denies any other symptoms, no pain.  Review of Systems  A thorough review of systems was obtained and all systems are negative except as noted in the HPI and PMH.   Patient's Health History    Past Medical History:  Diagnosis Date   Allergy    Asthma     History reviewed. No pertinent surgical history.  Family History  Problem Relation Age of Onset   Hypertension Father    Cancer Paternal Grandfather     Social History   Socioeconomic History   Marital status: Married    Spouse name: Not on file   Number of children: Not on file   Years of education: Not on file   Highest education level: Not on file  Occupational History   Not on file  Tobacco Use   Smoking status: Never   Smokeless tobacco: Current    Types: Chew  Vaping Use   Vaping status: Never Used  Substance and Sexual Activity   Alcohol use: Yes    Comment: 1/2 vodka past 2 days   Drug use: Yes    Types: Marijuana    Comment: daily   Sexual activity: Not on file  Other Topics Concern   Not on file  Social History Narrative   Not on file   Social Determinants of Health   Financial Resource Strain: Not on file  Food Insecurity: Not on file  Transportation Needs: Not on file  Physical Activity: Not on file  Stress: Not on file  Social Connections: Not on file  Intimate Partner Violence: Not on file     Physical Exam   Vitals:   08/28/23 0351 08/28/23 0400  BP: (!) 149/85  (!) 149/85  Pulse: 94 94  Resp: 18   Temp: 98.2 F (36.8 C)   SpO2: 100%     CONSTITUTIONAL: Well-appearing, NAD NEURO/PSYCH:  Alert and oriented x 3, no focal deficits EYES:  eyes equal and reactive ENT/NECK:  no LAD, no JVD CARDIO: Regular rate, well-perfused, normal S1 and S2 PULM:  CTAB no wheezing or rhonchi GI/GU:  non-distended, non-tender MSK/SPINE:  No gross deformities, no edema SKIN:  no rash, atraumatic   *Additional and/or pertinent findings included in MDM below  Diagnostic and Interventional Summary    EKG Interpretation Date/Time:  Tuesday August 28 2023 04:20:36 EST Ventricular Rate:  109 PR Interval:  161 QRS Duration:  93 QT Interval:  356 QTC Calculation: 480 R Axis:   89  Text Interpretation: Sinus tachycardia Borderline prolonged QT interval Confirmed by Kennis Carina [phone]) on 08/28/2023 4:26:21 AM       Labs Reviewed  CBC - Abnormal; Notable for the following components:      Result Value   WBC 12.3 (*)    Platelets 410 (*)    All other components within normal limits  COMPREHENSIVE METABOLIC PANEL - Abnormal; Notable for the following components:   Chloride 96 (*)  CO2 21 (*)    Albumin 5.3 (*)    ALT 55 (*)    Anion gap 23 (*)    All other components within normal limits  ETHANOL - Abnormal; Notable for the following components:   Alcohol, Ethyl (B) 195 (*)    All other components within normal limits    No orders to display    Medications - No data to display   Procedures  /  Critical Care Procedures  ED Course and Medical Decision Making  Initial Impression and Ddx Patient seems to intermittently embellish some tremulousness but I doubt acute significant alcohol withdrawal at this time.  Says he passed out.  Obtaining screening labs and EKG, will monitor closely for changes in symptoms.  Past medical/surgical history that increases complexity of ED encounter: None  Interpretation of Diagnostics I personally reviewed  the EKG and my interpretation is as follows: Sinus rhythm  Labs reassuring with no significant blood count or electrolyte disturbance.  Ethanol level is elevated  Patient Reassessment and Ultimate Disposition/Management     Appropriate for discharge.  Patient continues to express a desire to quit alcohol.  Providing Librium taper.  Patient is educated on the dangers of mixing Librium with alcohol and he promises not to do so.  Patient management required discussion with the following services or consulting groups:  None  Complexity of Problems Addressed Acute illness or injury that poses threat of life of bodily function  Additional Data Reviewed and Analyzed Further history obtained from: None  Additional Factors Impacting ED Encounter Risk Prescriptions  Elmer Sow. Pilar Plate, MD [facility] Emergency Medicine [facility] [email or web address]  Final Clinical Impressions(s) / ED Diagnoses     ICD-10-CM   1. Tremulousness  R25.1     2. Alcoholic intoxication with complication (HCC)  F10.929     3. Dehydration  E86.0       ED Discharge Orders          Ordered    chlordiazePOXIDE (LIBRIUM) 25 MG capsule        08/28/23 0526             Discharge Instructions Discussed with and Provided to Patient:     Discharge Instructions      You were evaluated in the Emergency Department and after careful evaluation, we did not find any emergent condition requiring admission or further testing in the hospital.  Your exam/testing today was overall reassuring.  Use the Librium medication to detox from alcohol as prescribed.  Plenty of fluids and rest.  Please return to the Emergency Department if you experience any worsening of your condition.  Thank you for allowing [place] to be a part of your care.        Sabas Sous, MD 08/28/23 [phone]
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
## 2023-09-07 ENCOUNTER — Ambulatory Visit
Admission: RE | Admit: 2023-09-07 | Discharge: 2023-09-07 | Disposition: A | Discharge status: Home / Self Care | Payer: Managed Care, Other (non HMO) | Source: Ambulatory Visit | Attending: Internal Medicine | Admitting: Internal Medicine

## 2023-09-07 VITALS — BP 134/86 | HR 71 | Temp 97.7°F | Resp 17

## 2023-09-07 DIAGNOSIS — Z202 Contact with and (suspected) exposure to infections with a predominantly sexual mode of transmission: Secondary | ICD-10-CM | POA: Diagnosis present

## 2023-09-07 DIAGNOSIS — Z113 Encounter for screening for infections with a predominantly sexual mode of transmission: Secondary | ICD-10-CM | POA: Insufficient documentation

## 2023-09-07 MED ORDER — AZITHROMYCIN 500 MG PO TABS
2000.0000 mg | ORAL_TABLET | Freq: Once | ORAL | Status: AC
  Administered 2023-09-07: 2000 mg via ORAL

## 2023-09-07 MED ORDER — GENTAMICIN SULFATE 40 MG/ML IJ SOLN
240.0000 mg | Freq: Once | INTRAMUSCULAR | Status: AC
  Administered 2023-09-07: 240 mg via INTRAMUSCULAR

## 2023-09-07 NOTE — ED Provider Notes (Signed)
[facility]    CSN: [id number] Arrival date & time: 09/07/23  1716      History   Chief Complaint Chief Complaint  Patient presents with   SEXUALLY TRANSMITTED DISEASE    Entered by patient    HPI Timothy Figueroa is a 27 y.o. male presents for STD screening.  Patient reports he has been exposed to gonorrhea but currently denies any symptoms including dysuria, testicular pain or swelling, penile rashes, penile discharge, fevers.  Denies history of STDs in the past.  No other concerns at this time.  HPI  Past Medical History:  Diagnosis Date   Allergy    Asthma     There are no problems to display for this patient.   History reviewed. No pertinent surgical history.     Home Medications    Prior to Admission medications   Medication Sig Start Date End Date Taking? Authorizing Provider  acetaminophen (TYLENOL) 500 MG tablet Take 500 mg by mouth every 6 (six) hours as needed for mild pain.    [provider]  Carboxymethylcellulose Sodium (REFRESH LIQUIGEL OP) Place 1 drop into both eyes daily as needed (dry eyes).    [provider]  chlordiazePOXIDE (LIBRIUM) 25 MG capsule 50mg  PO TID x 1D, then 25-50mg  PO BID X 1D, then 25-50mg  PO QD X 1D 08/28/23   Sabas Sous, MD  LACTASE PO Take 1 tablet by mouth daily.    [provider]  Multiple Vitamin (MULTIVITAMIN) tablet Take 1 tablet by mouth daily.    [provider]  Naphazoline HCl (CLEAR EYES OP) Place 1 drop into both eyes daily as needed (red eyes).    [provider]    Family History Family History  Problem Relation Age of Onset   Hypertension Father    Cancer Paternal Grandfather     Social History Social History   Tobacco Use   Smoking status: Never   Smokeless tobacco: Current    Types: Chew  Vaping Use   Vaping status: Never Used  Substance Use Topics   Alcohol use: Yes    Comment: 1/2 vodka past 2 days   Drug use: Yes    Types:  Marijuana    Comment: daily     Allergies   Penicillins and Sulfa antibiotics   Review of Systems Review of Systems  Genitourinary:        STD testing/exposure to gonorrhea     Physical Exam Triage Vital Signs ED Triage Vitals [09/07/23 1728]  Encounter Vitals Group     BP 134/86     Systolic BP Percentile      Diastolic BP Percentile      Pulse Rate 71     Resp 17     Temp 97.7 F (36.5 C)     Temp Source Oral     SpO2 96 %     Weight      Height      Head Circumference      Peak Flow      Pain Score 0     Pain Loc      Pain Education      Exclude from Growth Chart    No data found.  Updated Vital Signs BP 134/86 (BP Location: Left Arm)   Pulse 71   Temp 97.7 F (36.5 C) (Oral)   Resp 17   SpO2 96%   Visual Acuity Right Eye Distance:   Left Eye Distance:   Bilateral  Distance:    Right Eye Near:   Left Eye Near:    Bilateral Near:     Physical Exam Vitals and nursing note reviewed.  Constitutional:      Appearance: Normal appearance.  HENT:     Head: Normocephalic and atraumatic.  Eyes:     Pupils: Pupils are equal, round, and reactive to light.  Cardiovascular:     Rate and Rhythm: Normal rate.  Pulmonary:     Effort: Pulmonary effort is normal.  Abdominal:     Tenderness: There is no right CVA tenderness or left CVA tenderness.  Skin:    General: Skin is warm and dry.  Neurological:     General: No focal deficit present.     Mental Status: He is alert and oriented to person, place, and time.  Psychiatric:        Mood and Affect: Mood normal.        Behavior: Behavior normal.      UC Treatments / Results  Labs (all labs ordered are listed, but only abnormal results are displayed) Labs Reviewed  RPR  HIV ANTIBODY (ROUTINE TESTING W REFLEX)  CYTOLOGY, (ORAL, ANAL, URETHRAL) ANCILLARY ONLY    EKG   Radiology No results found.  Procedures Procedures (including critical care time)  Medications Ordered in UC Medications   gentamicin (GARAMYCIN) injection 240 mg (has no administration in time range)  azithromycin (ZITHROMAX) tablet 2,000 mg (has no administration in time range)    Initial Impression / Assessment and Plan / UC Course  I have reviewed the triage vital signs and the nursing notes.  Pertinent labs & imaging results that were available during my care of the patient were reviewed by me and considered in my medical decision making (see chart for details).     Reviewed exam and symptoms with patient.  No red flags.  STD testing is ordered we will contact for any positive results.  Patient with allergy to penicillin and does not know if he has ever had cephalosporins.  Given this we will treat for exposure to gonorrhea with Zithromax and gentamicin.  Patient monitored for 10 minutes after injection with no reaction noted and tolerated well.  PCP follow-up as needed.  ER precautions reviewed. Final Clinical Impressions(s) / UC Diagnoses   Final diagnoses:  Exposure to gonorrhea  Screening examination for STD (sexually transmitted disease)   Discharge Instructions   None    ED Prescriptions   None    PDMP not reviewed this encounter.   Radford Pax, NP 09/07/23 [phone]

## 2023-09-07 NOTE — Discharge Instructions (Addendum)
The clinic will contact you with any positive results of the testing done today.  You were treated for gonorrhea exposure while in clinic today.  Please abstain from sexual contact for 7 days after treatment.  Please follow-up with your PCP as needed.  Please go to the ER if you have any worsening symptoms.

## 2023-09-07 NOTE — ED Triage Notes (Signed)
Pt presents for STD testing. Was exposed to a recent partner who is positive for Gonorrhea. Has a rash days ago.

## 2023-09-08 LAB — HIV ANTIBODY (ROUTINE TESTING W REFLEX): HIV Screen 4th Generation wRfx: NONREACTIVE

## 2023-09-08 LAB — RPR: RPR Ser Ql: NONREACTIVE

## 2023-09-11 LAB — CYTOLOGY, (ORAL, ANAL, URETHRAL) ANCILLARY ONLY
Chlamydia: NEGATIVE
Comment: NEGATIVE
Comment: NEGATIVE
Comment: NORMAL
Neisseria Gonorrhea: NEGATIVE
Trichomonas: NEGATIVE

## 2023-10-11 ENCOUNTER — Ambulatory Visit
Admission: RE | Admit: 2023-10-11 | Discharge: 2023-10-11 | Disposition: A | Discharge status: Home / Self Care | Payer: Commercial Managed Care - HMO | Source: Ambulatory Visit

## 2023-10-11 VITALS — BP 142/84 | HR 69 | Temp 97.5°F | Resp 16

## 2023-10-11 DIAGNOSIS — M25511 Pain in right shoulder: Secondary | ICD-10-CM | POA: Diagnosis not present

## 2023-10-11 MED ORDER — CELECOXIB 200 MG PO CAPS: 200.0000 mg | ORAL_CAPSULE | Freq: Every day | ORAL | 0 refills | Status: AC

## 2023-10-11 NOTE — Discharge Instructions (Addendum)
Advised patient to take medication as directed with food to completion.  Encouraged increase daily water intake to 64 ounces per day while taking this medication.  Advised if symptoms worsen and/or unresolved please follow-up with PCP, [facility] (contact information provided with this AVS), or here for further evaluation.
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
## 2023-10-15 ENCOUNTER — Ambulatory Visit: Payer: Commercial Managed Care - HMO | Admitting: Family Medicine

## 2023-10-15 ENCOUNTER — Encounter: Payer: Self-pay | Admitting: Family Medicine

## 2023-10-15 ENCOUNTER — Ambulatory Visit (HOSPITAL_BASED_OUTPATIENT_CLINIC_OR_DEPARTMENT_OTHER)
Admission: RE | Admit: 2023-10-15 | Discharge: 2023-10-15 | Disposition: A | Discharge status: Home / Self Care | Payer: Commercial Managed Care - HMO | Source: Ambulatory Visit | Attending: Family Medicine | Admitting: Family Medicine

## 2023-10-15 VITALS — BP 120/76 | Ht 72.0 in | Wt 230.0 lb

## 2023-10-15 DIAGNOSIS — M25511 Pain in right shoulder: Secondary | ICD-10-CM
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
## 2023-10-23 ENCOUNTER — Telehealth: Payer: Self-pay | Admitting: *Deleted

## 2023-10-23 ENCOUNTER — Other Ambulatory Visit: Payer: Self-pay | Admitting: Family Medicine

## 2023-10-23 DIAGNOSIS — S43011A Anterior subluxation of right humerus, initial encounter: Secondary | ICD-10-CM

## 2023-10-23 NOTE — Telephone Encounter (Signed)
 Left message for patient to call back re below: From Dr. Marcene Seen 10/15/23. Nothing abnormal on shoulder X-ray, which is great. Could he be contacted to communicate, and see if he is still interested in getting an MRI? The reason would be to look further into his history of shoulder dislocation. would be an MRI shoulder arthrogram to look into labral pathology. The alternative to this is to trial a course of PT first (which was ordered) and see how he recovers with respect to pain/stability. wanted to offer in case he's been having concerns re: deductible/$$ for imaging

## 2023-11-06 ENCOUNTER — Ambulatory Visit: Payer: Commercial Managed Care - HMO | Admitting: Physical Therapy

## 2023-12-03 ENCOUNTER — Ambulatory Visit: Payer: Commercial Managed Care - HMO | Admitting: Family Medicine

## 2023-12-07 ENCOUNTER — Telehealth: Payer: Self-pay | Admitting: Family Medicine

## 2023-12-07 NOTE — Telephone Encounter (Signed)
Patient contacted today to follow-up on shoulder pain. He shares he has been doing pretty well since initial visit. XR results discussed with patient, reassuring findings. During initial visit had described an event consistent with a full shoulder dislocation, not the first time he has had a dislocation, has not received imaging for it prior to this. PT referral had been placed, patient shares this ended up being too expensive and has been doing his own exercises at home. Pain is improved overall, but still has some pain concerns. Discussed options for management work-up, declines MRI for further assessment at this time. All questions answered. Return precautions discussed. Patient verbalized understanding and is in agreement with plan

## 2024-04-13 ENCOUNTER — Ambulatory Visit (HOSPITAL_COMMUNITY)
Admission: EM | Admit: 2024-04-13 | Discharge: 2024-04-14 | Disposition: A | Discharge status: Other Acute Inpatient Hospital | Attending: Nurse Practitioner | Admitting: Nurse Practitioner

## 2024-04-13 DIAGNOSIS — Z91148 Patient's other noncompliance with medication regimen for other reason: Secondary | ICD-10-CM | POA: Insufficient documentation

## 2024-04-13 DIAGNOSIS — F312 Bipolar disorder, current episode manic severe with psychotic features: Secondary | ICD-10-CM | POA: Insufficient documentation

## 2024-04-13 DIAGNOSIS — Z79899 Other long term (current) drug therapy: Secondary | ICD-10-CM | POA: Insufficient documentation

## 2024-04-13 DIAGNOSIS — F1290 Cannabis use, unspecified, uncomplicated: Secondary | ICD-10-CM | POA: Insufficient documentation

## 2024-04-13 NOTE — Progress Notes (Signed)
   04/13/24 2114  [facility] Triage Screening (Walk-ins at [facility] only)  What Is the Reason for Your Visit/Call Today? Pt arrived today accompanied by his mother with a concern of his mental health he has been in a manic state and insomnia. He has had trouble sleeping for about 2 weeks. He claims he has been overwhelmed and is currently manic, rambling about politics, qunatum holes, and other fixations. He is diagnosed with bipolar I with manic episodes and he is not medicated and he had tried medications and did not have a good affect. Per his mom he tried lamictal but he is not open to trying medication. He was observed rambling and speaking about immense pressure on his soul and feeling evil energy.  How Long Has This Been Causing You Problems? 1 wk - 1 month  Have You Recently Had Any Thoughts About Hurting Yourself? Yes  Are You Planning to Commit Suicide/Harm Yourself At This time? No  Have you Recently Had Thoughts About Hurting Someone Sherral? Yes  Are You Planning To Harm Someone At This Time? No  Physical Abuse Denies  Verbal Abuse Denies  Sexual Abuse Denies  Exploitation of patient/patient's resources Denies  Self-Neglect Denies  Are you currently experiencing any auditory, visual or other hallucinations? Yes  Please explain the hallucinations you are currently experiencing: He claims that he is being controlled by [name] and is seeing and feeling immense pressures.  Have You Used Any Alcohol or Drugs in the Past 24 Hours? Yes  What Did You Use and How Much? A little but of weed  Do you have any current medical co-morbidities that require immediate attention? No

## 2024-04-14 DIAGNOSIS — F312 Bipolar disorder, current episode manic severe with psychotic features: Secondary | ICD-10-CM

## 2024-04-14 DIAGNOSIS — F1290 Cannabis use, unspecified, uncomplicated: Secondary | ICD-10-CM | POA: Diagnosis not present

## 2024-04-14 DIAGNOSIS — F319 Bipolar disorder, unspecified: Secondary | ICD-10-CM | POA: Diagnosis present

## 2024-04-14 DIAGNOSIS — Z79899 Other long term (current) drug therapy: Secondary | ICD-10-CM | POA: Diagnosis not present

## 2024-04-14 DIAGNOSIS — Z91148 Patient's other noncompliance with medication regimen for other reason: Secondary | ICD-10-CM | POA: Diagnosis not present

## 2024-04-14 LAB — LIPID PANEL
Cholesterol: 131 mg/dL (ref 0–200)
HDL: 52 mg/dL (ref >40)
LDL Cholesterol: 57 mg/dL (ref 0–99)
Total CHOL/HDL Ratio: 2.5 ratio
Triglycerides: 109 mg/dL (ref <150)
VLDL: 22 mg/dL (ref 0–40)

## 2024-04-14 LAB — COMPREHENSIVE METABOLIC PANEL WITH GFR
ALT: 34 U/L (ref 0–44)
AST: 21 U/L (ref 15–41)
Albumin: 4.1 g/dL (ref 3.5–5.0)
Alkaline Phosphatase: 54 U/L (ref 38–126)
Anion gap: 12 (ref 5–15)
BUN: 13 mg/dL (ref 6–20)
CO2: 25 mmol/L (ref 22–32)
Calcium: 9.7 mg/dL (ref 8.9–10.3)
Chloride: 101 mmol/L (ref 98–111)
Creatinine, Ser: 1.02 mg/dL (ref 0.61–1.24)
GFR, Estimated: >60 mL/min (ref >60)
Glucose, Bld: 93 mg/dL (ref 70–99)
Potassium: 3.6 mmol/L (ref 3.5–5.1)
Sodium: 138 mmol/L (ref 135–145)
Total Bilirubin: 0.6 mg/dL (ref 0.0–1.2)
Total Protein: 7.1 g/dL (ref 6.5–8.1)

## 2024-04-14 LAB — CBC WITH DIFFERENTIAL/PLATELET
Abs Immature Granulocytes: 0.02 10*3/uL (ref 0.00–0.07)
Basophils Absolute: 0 10*3/uL (ref 0.0–0.1)
Basophils Relative: 0 %
Eosinophils Absolute: 0.5 10*3/uL (ref 0.0–0.5)
Eosinophils Relative: 5 %
HCT: 44.9 % (ref 39.0–52.0)
Hemoglobin: 15.2 g/dL (ref 13.0–17.0)
Immature Granulocytes: 0 %
Lymphocytes Relative: 36 %
Lymphs Abs: 3.7 10*3/uL (ref 0.7–4.0)
MCH: 28.4 pg (ref 26.0–34.0)
MCHC: 33.9 g/dL (ref 30.0–36.0)
MCV: 83.9 fL (ref 80.0–100.0)
Monocytes Absolute: 0.8 10*3/uL (ref 0.1–1.0)
Monocytes Relative: 8 %
Neutro Abs: 5.3 10*3/uL (ref 1.7–7.7)
Neutrophils Relative %: 51 %
Platelets: 349 10*3/uL (ref 150–400)
RBC: 5.35 MIL/uL (ref 4.22–5.81)
RDW: 11.9 % (ref 11.5–15.5)
WBC: 10.3 10*3/uL (ref 4.0–10.5)
nRBC: 0 % (ref 0.0–0.2)

## 2024-04-14 LAB — POCT URINE DRUG SCREEN - MANUAL ENTRY (I-SCREEN)
POC Amphetamine UR: POSITIVE — AB
POC Buprenorphine (BUP): NOT DETECTED
POC Cocaine UR: POSITIVE — AB
POC Marijuana UR: POSITIVE — AB
POC Methadone UR: NOT DETECTED
POC Methamphetamine UR: POSITIVE — AB
POC Morphine: NOT DETECTED
POC Oxazepam (BZO): NOT DETECTED
POC Oxycodone UR: NOT DETECTED
POC Secobarbital (BAR): NOT DETECTED

## 2024-04-14 LAB — ETHANOL: Alcohol, Ethyl (B): <15 mg/dL (ref <15)

## 2024-04-14 LAB — TSH: TSH: 3.43 u[IU]/mL (ref 0.350–4.500)

## 2024-04-14 MED ORDER — DIPHENHYDRAMINE HCL 50 MG PO CAPS
50.0000 mg | ORAL_CAPSULE | Freq: Three times a day (TID) | ORAL | Status: DC | PRN
  Filled 2024-04-14: qty 1

## 2024-04-14 MED ORDER — TRAZODONE HCL 50 MG PO TABS: 50.0000 mg | ORAL_TABLET | Freq: Every evening | ORAL | Status: DC | PRN

## 2024-04-14 MED ORDER — ALUM & MAG HYDROXIDE-SIMETH 200-200-20 MG/5ML PO SUSP: 30.0000 mL | ORAL | Status: DC | PRN

## 2024-04-14 MED ORDER — LAMOTRIGINE 25 MG PO TABS
25.0000 mg | ORAL_TABLET | Freq: Every day | ORAL | Status: DC
  Filled 2024-04-14: qty 1

## 2024-04-14 MED ORDER — OLANZAPINE 10 MG PO TABS
10.0000 mg | ORAL_TABLET | Freq: Every day | ORAL | Status: DC
  Administered 2024-04-14: 10 mg via ORAL
  Filled 2024-04-14: qty 1

## 2024-04-14 MED ORDER — HALOPERIDOL LACTATE 5 MG/ML IJ SOLN: 10.0000 mg | Freq: Three times a day (TID) | INTRAMUSCULAR | Status: DC | PRN

## 2024-04-14 MED ORDER — ACETAMINOPHEN 325 MG PO TABS: 650.0000 mg | ORAL_TABLET | Freq: Four times a day (QID) | ORAL | Status: DC | PRN

## 2024-04-14 MED ORDER — DIPHENHYDRAMINE HCL 50 MG/ML IJ SOLN: 50.0000 mg | Freq: Three times a day (TID) | INTRAMUSCULAR | Status: DC | PRN

## 2024-04-14 MED ORDER — HALOPERIDOL LACTATE 5 MG/ML IJ SOLN: 5.0000 mg | Freq: Three times a day (TID) | INTRAMUSCULAR | Status: DC | PRN

## 2024-04-14 MED ORDER — HYDROXYZINE HCL 25 MG PO TABS: 25.0000 mg | ORAL_TABLET | Freq: Three times a day (TID) | ORAL | Status: DC | PRN

## 2024-04-14 MED ORDER — LORAZEPAM 2 MG/ML IJ SOLN: 2.0000 mg | Freq: Three times a day (TID) | INTRAMUSCULAR | Status: DC | PRN

## 2024-04-14 MED ORDER — MAGNESIUM HYDROXIDE 400 MG/5ML PO SUSP: 30.0000 mL | Freq: Every day | ORAL | Status: DC | PRN

## 2024-04-14 MED ORDER — HALOPERIDOL 5 MG PO TABS: 5.0000 mg | ORAL_TABLET | Freq: Three times a day (TID) | ORAL | Status: DC | PRN

## 2024-04-14 NOTE — ED Provider Notes (Signed)
 Behavioral Health Progress Note  Date and Time: 04/14/2024 8:45 AM Name: Timothy Figueroa MRN:  [id number]  HPI: Timothy Figueroa is a 28 y.o. male w/ past hx of bipolar disorder who presents with delusions, bizarre behavioral, and agitation per family. See H&P for details about HPI.   Subjective:  pt is minimizing symptoms. Reports having had delusions but they are better. Reports that he is unsure about weight loss. 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 Reports that he is unsure if he has had presentations similar to this in the past. Does not comment on his sleep recently. Reports that he wants to leave. Reports that he has not been adherent to medications but does not clarify why. Reports that he does not want inpatient and does not want to safety plan home either with medications. Discussed that he would likely need hospital involuntarily if he does not consent. Denies any withdrawal from alcohol or other substances at this time.  Collateral, father, [phone]: called to get any information that this collateral would have but did not disclose pt's name or any other HIPAA information. reports that he is having delusions about mega rays. Reported that he had called the police b/c of concern for some scanner. Reports that he is becoming more and more agitated and more and more detatched from reality. Reports that he is not obviously drinking. Reports that he is diagnosed with bipolar. Reports that son is getting his PhD in chemistry and father had to come get him from work. Reports that he has a daughter.    Diagnosis:  Final diagnoses:  Bipolar affective disorder, currently manic, severe, with psychotic features (HCC)    Total Time spent with patient: 45 minutes  Past Psychiatric History: Bipolar disorder dx per family. past med trials of cariprazine, depakote, abilify, lexapro. Current meds are olanzapine 10 and lamotrigine 25 which he is not taking. No past  psychiatric hospitalization per patient or chart review.  Past Medical History: pt denies dx, meds, TBI, seizures. Pt reports sulfa and penicillin allergy.  Family History: no pertinent Family Psychiatric  History: no pertinent Social History: lives with family. Getting PhD in chemistry. Lives in [place]. Has a young daughter.   Sleep: Poor  Appetite:  Poor  Current Medications:  Current Facility-Administered Medications  Medication Dose Route Frequency Provider Last Rate Last Admin   acetaminophen (TYLENOL) tablet 650 mg  650 mg Oral Q6H PRN Bobbitt, Shalon E, NP       alum & mag hydroxide-simeth (MAALOX/MYLANTA) 200-200-20 MG/5ML suspension 30 mL  30 mL Oral Q4H PRN Bobbitt, Shalon E, NP       haloperidol (HALDOL) tablet 5 mg  5 mg Oral TID PRN Bobbitt, Shalon E, NP       And   diphenhydrAMINE (BENADRYL) capsule 50 mg  50 mg Oral TID PRN Bobbitt, Shalon E, NP       haloperidol lactate (HALDOL) injection 5 mg  5 mg Intramuscular TID PRN Bobbitt, Shalon E, NP       And   diphenhydrAMINE (BENADRYL) injection 50 mg  50 mg Intramuscular TID PRN Bobbitt, Shalon E, NP       And   LORazepam (ATIVAN) injection 2 mg  2 mg Intramuscular TID PRN Bobbitt, Shalon E, NP       haloperidol lactate (HALDOL) injection 10 mg  10 mg Intramuscular TID PRN Bobbitt, Shalon E, NP       And   diphenhydrAMINE (BENADRYL) injection 50  mg  50 mg Intramuscular TID PRN Bobbitt, Shalon E, NP       And   LORazepam (ATIVAN) injection 2 mg  2 mg Intramuscular TID PRN Bobbitt, Shalon E, NP       hydrOXYzine (ATARAX) tablet 25 mg  25 mg Oral TID PRN Bobbitt, Shalon E, NP       lamoTRIgine (LAMICTAL) tablet 25 mg  25 mg Oral Daily Bobbitt, Shalon E, NP       magnesium hydroxide (MILK OF MAGNESIA) suspension 30 mL  30 mL Oral Daily PRN Bobbitt, Shalon E, NP       OLANZapine (ZYPREXA) tablet 10 mg  10 mg Oral QHS Bobbitt, Shalon E, NP   10 mg at 04/14/24 0109   traZODone (DESYREL) tablet 50 mg  50 mg Oral QHS PRN  Bobbitt, Shalon E, NP       Current Outpatient Medications  Medication Sig Dispense Refill   clindamycin-benzoyl peroxide (BENZACLIN) gel Apply 1 Application topically 2 (two) times daily.     OLANZapine (ZYPREXA) 10 MG tablet Take 10 mg by mouth at bedtime.     tretinoin (RETIN-A) 0.05 % cream Apply 1 Application topically at bedtime.     lamoTRIgine (LAMICTAL) 25 MG tablet Take 25 mg by mouth daily. (Patient not taking: Reported on 04/14/2024)      Labs  Lab Results:  Admission on 04/13/2024  Component Date Value Ref Range Status   WBC 04/14/2024 10.3  4.0 - 10.5 K/uL Final   RBC 04/14/2024 5.35  4.22 - 5.81 MIL/uL Final   Hemoglobin 04/14/2024 15.2  13.0 - 17.0 g/dL Final   HCT 93/76/7974 44.9  39.0 - 52.0 % Final   MCV 04/14/2024 83.9  80.0 - 100.0 fL Final   MCH 04/14/2024 28.4  26.0 - 34.0 pg Final   MCHC 04/14/2024 33.9  30.0 - 36.0 g/dL Final   RDW 93/76/7974 11.9  11.5 - 15.5 % Final   Platelets 04/14/2024 349  150 - 400 K/uL Final   nRBC 04/14/2024 0.0  0.0 - 0.2 % Final   Neutrophils Relative % 04/14/2024 51  % Final   Neutro Abs 04/14/2024 5.3  1.7 - 7.7 K/uL Final   Lymphocytes Relative 04/14/2024 36  % Final   Lymphs Abs 04/14/2024 3.7  0.7 - 4.0 K/uL Final   Monocytes Relative 04/14/2024 8  % Final   Monocytes Absolute 04/14/2024 0.8  0.1 - 1.0 K/uL Final   Eosinophils Relative 04/14/2024 5  % Final   Eosinophils Absolute 04/14/2024 0.5  0.0 - 0.5 K/uL Final   Basophils Relative 04/14/2024 0  % Final   Basophils Absolute 04/14/2024 0.0  0.0 - 0.1 K/uL Final   Immature Granulocytes 04/14/2024 0  % Final   Abs Immature Granulocytes 04/14/2024 0.02  0.00 - 0.07 K/uL Final   Performed at [facility], [address], [place], [place] [address]   Sodium 04/14/2024 138  135 - 145 mmol/L Final   Potassium 04/14/2024 3.6  3.5 - 5.1 mmol/L Final   Chloride 04/14/2024 101  98 - 111 mmol/L Final   CO2 04/14/2024 25  22 - 32 mmol/L Final   Glucose, Bld 04/14/2024 93   70 - 99 mg/dL Final   Glucose reference range applies only to samples taken after fasting for at least 8 hours.   BUN 04/14/2024 13  6 - 20 mg/dL Final   Creatinine, Ser 04/14/2024 1.02  0.61 - 1.24 mg/dL Final   Calcium 93/76/7974 9.7  8.9 - 10.3 mg/dL Final   Total Protein 93/76/7974 7.1  6.5 - 8.1 g/dL Final   Albumin 93/76/7974 4.1  3.5 - 5.0 g/dL Final   AST 93/76/7974 21  15 - 41 U/L Final   ALT 04/14/2024 34  0 - 44 U/L Final   Alkaline Phosphatase 04/14/2024 54  38 - 126 U/L Final   Total Bilirubin 04/14/2024 0.6  0.0 - 1.2 mg/dL Final   GFR, Estimated 04/14/2024 >60  >60 mL/min Final   Comment: (NOTE) Calculated using the CKD-EPI Creatinine Equation (2021)    Anion gap 04/14/2024 12  5 - 15 Final   Performed at [facility], [address] [place], [place] [address]   Cholesterol 04/14/2024 131  0 - 200 mg/dL Final   Triglycerides 93/76/7974 109  <150 mg/dL Final   HDL 93/76/7974 52  >40 mg/dL Final   Total CHOL/HDL Ratio 04/14/2024 2.5  RATIO Final   VLDL 04/14/2024 22  0 - 40 mg/dL Final   LDL Cholesterol 04/14/2024 57  0 - 99 mg/dL Final   Comment:        Total Cholesterol/HDL:CHD Risk Coronary Heart Disease Risk Table                     Men   Women  1/2 Average Risk   3.4   3.3  Average Risk       5.0   4.4  2 X Average Risk   9.6   7.1  3 X Average Risk  23.4   11.0        Use the calculated Patient Ratio above and the CHD Risk Table to determine the patient's CHD Risk.        ATP III CLASSIFICATION (LDL):  <100     mg/dL   Optimal  899-870  mg/dL   Near or Above                    Optimal  130-159  mg/dL   Borderline  839-810  mg/dL   High  >809     mg/dL   Very High Performed at [facility], [address] [place], [place] [address]    Alcohol, Ethyl (B) 04/14/2024 <15  <15 mg/dL Final   Comment: (NOTE) For medical purposes only. Performed at [facility], [address], [place], [place] [address]    TSH 04/14/2024 3.430  0.350  - 4.500 uIU/mL Final   Comment: Performed by a 3rd Generation assay with a functional sensitivity of <=0.01 uIU/mL. Performed at [facility], [address] [place], [place] [address]    POC Amphetamine UR 04/14/2024 Positive (A)  NONE DETECTED (Cut Off Level 1000 ng/mL) Final   POC Secobarbital (BAR) 04/14/2024 None Detected  NONE DETECTED (Cut Off Level 300 ng/mL) Final   POC Buprenorphine (BUP) 04/14/2024 None Detected  NONE DETECTED (Cut Off Level 10 ng/mL) Final   POC Oxazepam (BZO) 04/14/2024 None Detected  NONE DETECTED (Cut Off Level 300 ng/mL) Final   POC Cocaine UR 04/14/2024 Positive (A)  NONE DETECTED (Cut Off Level 300 ng/mL) Final   POC Methamphetamine UR 04/14/2024 Positive (A)  NONE DETECTED (Cut Off Level 1000 ng/mL) Final   POC Morphine 04/14/2024 None Detected  NONE DETECTED (Cut Off Level 300 ng/mL) Final   POC Methadone UR 04/14/2024 None Detected  NONE DETECTED (Cut Off Level 300 ng/mL) Final   POC Oxycodone UR 04/14/2024 None  Detected  NONE DETECTED (Cut Off Level 100 ng/mL) Final   POC Marijuana UR 04/14/2024 Positive (A)  NONE DETECTED (Cut Off Level 50 ng/mL) Final    Blood Alcohol level:  Lab Results  Component Value Date   ETH <15 04/14/2024   ETH 195 (H) 08/28/2023    Metabolic Disorder Labs: No results found for: HGBA1C, MPG No results found for: PROLACTIN Lab Results  Component Value Date   CHOL 131 04/14/2024   TRIG 109 04/14/2024   HDL 52 04/14/2024   CHOLHDL 2.5 04/14/2024   VLDL 22 04/14/2024   LDLCALC 57 04/14/2024    Therapeutic Lab Levels: No results found for: LITHIUM No results found for: VALPROATE No results found for: CBMZ  Physical Findings   Flowsheet Row ED from 04/13/2024 in [facility] from 10/11/2023 in [facility] [place] [facility] from 09/07/2023 in [facility])  C-SSRS RISK CATEGORY No Risk No Risk No Risk      Musculoskeletal  Strength & Muscle Tone: within normal limits Gait & Station: normal Patient leans: N/A  Psychiatric Specialty Exam  Presentation  General Appearance:  Casual  Eye Contact: Fleeting  Speech: Clear and Coherent  Speech Volume: Normal  Handedness: Right   Mood and Affect  Mood: Labile  Affect: Labile   Thought Process  Thought Processes: Disorganized  Descriptions of Associations:Loose  Orientation:Partial  Thought Content:Delusions  Diagnosis of Schizophrenia or Schizoaffective disorder in past: No  Duration of Psychotic Symptoms: Less than six months   Hallucinations:Hallucinations: Auditory Description of Auditory Hallucinations: Hears AI voice talking to him  Ideas of Reference:Delusions  Suicidal Thoughts:Suicidal Thoughts: No  Homicidal Thoughts:Homicidal Thoughts: No   Sensorium  Memory: Immediate Fair; Remote Fair  Judgment: Poor  Insight: Poor   Executive Functions  Concentration: Poor  Attention Span: Poor  Recall: Fair  Fund of Knowledge: Fair  Language: Fair   Psychomotor Activity  Psychomotor Activity: Psychomotor Activity: Normal   Assets  Assets: Manufacturing systems engineer; Housing; Vocational/Educational; Financial Resources/Insurance   Sleep  Sleep: Sleep: Poor  No Safety Checks orders active in given range  Nutritional Assessment (For OBS and FBC admissions only) Has the patient had a weight loss or gain of 10 pounds or more in the last 3 months?: No Has the patient had a decrease in food intake/or appetite?: No Does the patient have dental problems?: No Does the patient have eating habits or behaviors that may be indicators of an eating disorder including binging or inducing vomiting?: No Has the patient recently lost weight without trying?: 1 Has the patient been eating poorly because of a decreased appetite?: 1 Malnutrition Screening Tool Score: 2    Physical Exam  Physical  Exam Vitals and nursing note reviewed.  Constitutional:      General: He is not in acute distress.    Appearance: He is well-developed.  HENT:     Head: Normocephalic and atraumatic.  Pulmonary:     Effort: Pulmonary effort is normal. No respiratory distress.   Musculoskeletal:        General: No swelling.   Neurological:     General: No focal deficit present.     Mental Status: He is alert.    Review of Systems  Constitutional:  Negative for fever.  Cardiovascular:  Negative for chest pain and palpitations.  Gastrointestinal:  Negative for constipation, diarrhea, nausea and vomiting.  Neurological:  Negative for dizziness, weakness and headaches.  Blood pressure 107/61, pulse 67, temperature 98.4 F (36.9 C), temperature source Oral, resp. rate 18, SpO2 100%. There is no height or weight on file to calculate BMI.  Treatment Plan Summary: Daily contact with patient to assess and evaluate symptoms and progress in treatment, Medication management, and Plan to recommend inpatient psychiatry under IVC. Pt is harm to self given mania with psychotic features and progressive decline over the last week which likely to progress further without hospitalizations and medication intervention.   Justino Cornish, MD PGY-1 Psychiatry Resident 04/14/2024, 9:07 AM

## 2024-04-14 NOTE — BH Assessment (Signed)
 Comprehensive Clinical Assessment (CCA) Note  04/14/2024 Timothy Figueroa [id number]  Disposition: Timothy Olp, NP, recommends inpatient treatment.   Chief Complaint:  Chief Complaint  Patient presents with   IVC    Manic, Paranoid   Visit Diagnosis:  Paranoia  Timothy Figueroa is a 28 year old male presenting as a 28 year old walk in to [facility] Figueroa to bizarre behaviors. Patient presents with flight of ideas and continues to rambling about politics, quantum holes and other fixations. Patient reports being overwhelmed. Mother was brought in by mother Timothy Figueroa. Patient gave consent to speak to mother for additional information.   During assessment patient continues to ramble. Patient states quantum was rolled up in space beside space. Patient reports stressors, complicated plot, mafia, I got a molecule, found it and fixed it. When asked patient to repeat what he said, he states the air force, they don't want me to say it again. Patient reports receiving AI waves directly as responses. Patient reports wanting to get deprogrammed. Patient states parameters output metaphors built up climax at the end of the movie. Patient states I like to go fast.   Patient resides with parents. Patient has a 22 year old daughter. 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 Patient has no access to guns. Patient was bizarre and manic during assessment.   Per IVC, completed by Timothy Figueroa presented to [facility] accompanied by his mother Timothy Figueroa who was concerned about patient increasing bizarre behaviors, smoking delta 9, not sleeping and med the statement that Timothy Figueroa wanted him to commit suicide and requested his mother bring him to the hospital.  Ms. Figueroa reports that patient had an outburst at work on Friday, and her husband had to pick him up from work.  Timothy Figueroa reported that her son Timothy Figueroa has been diagnosed with bipolar disorder  but is not taking his prescribed medications.  Timothy Figueroa reports that the patient stayed up most nights playing video games.  Mother reports that patient had poor appetite and has lost about 30-40 in the last 7-8 months.  During my evaluation, Timothy Figueroa stated AI was communicating with him directly into his brain and that AI  was in love with him. Patient stated that the reason he came to [facility]  tonight was because it was almost to the end of the movie and it was getting to have a resolution. Patient stated that he has a history of alcoholism and was hospitalized in 2015 but mother reports that the has not observed any drinking in several months. Patient is very bizarre and tangential, rambling about space, time and quantum. Patient stated that he was hearing signs from the earth. Patient endorsed that he has been smoking delta 9 and smoked today.   Collateral contact, mother, Timothy Figueroa. Mother reports patient was screaming and became agitated stating he needs to go to the hospital. Patient called 911 the other day and told them AI and outside forces were trying to get him. Mother reported patient feels that Timothy Figueroa is trying to get him to commit suicide. Mother reports patient is seeing Timothy [facility]. Patient has not slept in several months. Mother reports last Friday patients father had to pick him up from school after receiving a call from staff Figueroa to an incident that took place at school, details are unknown. Mother reports patients behaviors are increasingly more bizarre. Mother reports patient  is talking to himself. Mother reports patient speaks of an outside force, AI and obsessively reads the news. Mother reports patient wanders around outside and smokes marijuana. Mother reports patient has not had any alcohol since 08/2023. Mother reports patient has been going downhill for the past 2 weeks. Mother reports patient was slumped over at his daughters birthday  party. Mother reports patient speaks of people trying to hurt him. Mother reports patient was screaming and agitated and stated the needed to got to the hospital today.    CCA Screening, Triage and Referral (STR)  Patient Reported Information How did you hear about us ? No data recorded What Is the Reason for Your Visit/Call Today? Pt arrived today accompanied by his mother with a concern of his mental health he has been in a manic state and insomnia. He has had trouble sleeping for about 2 weeks. He claims he has been overwhelmed and is currently manic, rambling about politics, qunatum holes, and other fixations. He is diagnosed with bipolar I with manic episodes and he is not medicated and he had tried medications and did not have a good affect. Per his mom he tried lamictal but he is not open to trying medication. He was observed rambling and speaking about immense pressure on his soul and feeling evil energy.  How Long Has This Been Causing You Problems? 1 wk - 1 month  What Do You Feel Would Help You the Most Today? No data recorded  Have You Recently Had Any Thoughts About Hurting Yourself? No  Are You Planning to Commit Suicide/Harm Yourself At This time? No   Flowsheet Row ED from 04/13/2024 in [facility] from 10/11/2023 in [facility] [place] [facility] from 09/07/2023 in [facility])  C-SSRS RISK CATEGORY No Risk No Risk No Risk    Have you Recently Had Thoughts About Hurting Someone Timothy Figueroa? No  Are You Planning to Harm Someone at This Time? No  Explanation: n/a   Have You Used Any Alcohol or Drugs in the Past 24 Hours? Yes  How Long Ago Did You Use Drugs or Alcohol? N/a What Did You Use and How Much? A little but of weed   Do You Currently Have a Therapist/Psychiatrist? No  Name of Therapist/Psychiatrist:  n/a  Have You Been Recently Discharged From Any Office Practice or Programs?  No  Explanation of Discharge From Practice/Program: n/a    CCA Screening Triage Referral Assessment Type of Contact: Face-to-Face  Telemedicine Service Delivery:  n/a Is this Initial or Reassessment?  N/a Date Telepsych consult ordered in [facility]:   N/a Time Telepsych consult ordered in [facility]:   N/a Location of Assessment: [facility]  Provider Location: [facility] Assessment Services   Collateral Involvement: mother   Does Patient Have a Automotive engineer Guardian? No  Legal Guardian Contact Information: n/a  Copy of Legal Guardianship Form: -- (n/a)  Legal Guardian Notified of Arrival: -- (n/a)  Legal Guardian Notified of Pending Discharge: -- (n/a)  If Minor and Not Living with Parent(s), Who has Custody? n/a  Is CPS involved or ever been involved? Never  Is APS involved or ever been involved? Never   Patient Determined To Be At Risk for Harm To Self or Others Based on Review of Patient Reported Information or Presenting Complaint? Yes, for Self-Harm (per mother)  Method: No Plan  Availability of Means: No access or NA  Intent: Vague intent  or NA  Notification Required: No need or identified person (n/a)  Additional Information for Danger to Others Potential: -- (n/a)  Additional Comments for Danger to Others Potential: n/a  Are There Guns or Other Weapons in Your Home? No  Types of Guns/Weapons: n/a  Are These Weapons Safely Secured?                            -- (n/a)  Who Could Verify You Are Able To Have These Secured: n/a  Do You Have any Outstanding Charges, Pending Court Dates, Parole/Probation? none reported  Contacted To Inform of Risk of Harm To Self or Others: -- (n/a)    Does Patient Present under Involuntary Commitment? No    [place] of Residence: [place]   Patient Currently Receiving the Following Services: Not Receiving Services   Determination of Need: Emergent (2 hours)   Options For Referral: Outpatient  Therapy; Inpatient Hospitalization; Medication Management; BH Urgent Care     CCA Biopsychosocial Patient Reported Schizophrenia/Schizoaffective Diagnosis in Past: No   Strengths: Phd student in Chemistry   Mental Health Symptoms Depression:  -- ginette)   Duration of Depressive symptoms:    Mania:  Racing thoughts; Overconfidence; Increased Energy; Change in energy/activity   Anxiety:   Worrying; Tension; Sleep; Restlessness; Irritability; Fatigue; Difficulty concentrating   Psychosis:  Delusions   Duration of Psychotic symptoms: Duration of Psychotic Symptoms: Less than six months   Trauma:  None   Obsessions:  None   Compulsions:  None   Inattention:  None   Hyperactivity/Impulsivity:  None   Oppositional/Defiant Behaviors:  None   Emotional Irregularity:  None   Other Mood/Personality Symptoms:  n/a    Mental Status Exam Appearance and self-care  Stature:  Average   Weight:  Average weight   Clothing:  Neat/clean   Grooming:  Normal   Cosmetic use:  None   Posture/gait:  Normal   Motor activity:  Restless   Sensorium  Attention:  Confused; Distractible   Concentration:  Preoccupied; Scattered; Variable; Anxiety interferes; Focuses on irrelevancies   Orientation:  -- ginette)   Recall/memory:  -- ginette)   Affect and Mood  Affect:  Anxious   Mood:  Anxious   Relating  Eye contact:  Normal   Facial expression:  Responsive; Tense; Anxious   Attitude toward examiner:  Cooperative   Thought and Language  Speech flow: Flight of Ideas   Thought content:  Delusions   Preoccupation:  Ruminations   Hallucinations:  Other (Comment)   Organization:  Disorganized   Company secretary of Knowledge:  Average   Intelligence:  Average   Abstraction:  Functional   Judgement:  Impaired   Reality Testing:  Distorted   Insight:  Flashes of insight   Decision Making:  Confused; Impulsive   Social Functioning  Social Maturity:   Impulsive   Social Judgement:  Heedless   Stress  Stressors:  Other (Comment)   Coping Ability:  Overwhelmed; Exhausted   Skill Deficits:  Self-control   Supports:  Family     Religion: Religion/Spirituality Are You A Religious Person?:  Industrial/product designer) How Might This Affect Treatment?: n/a  Leisure/Recreation: Leisure / Recreation Do You Have Hobbies?:  (n/a)  Exercise/Diet: Exercise/Diet Do You Exercise?: No Have You Gained or Lost A Significant Amount of Weight in the Past Six Months?: No Do You Follow a Special Diet?: No Do You Have Any Trouble Sleeping?: Yes Explanation of Sleeping  Difficulties: per mother no sleep in the past several months   CCA Employment/Education Employment/Work Situation: Employment / Work Situation Employment Situation: Surveyor, minerals Job has Been Impacted by Current Illness: Yes Describe how Patient's Job has Been Impacted: inteferes with internship Has Patient ever Been in the [employer]?: No  Education: Education Is Patient Currently Attending School?: No Last Grade Completed: 12 Did You Attend College?: Yes What Type of College Degree Do you Have?: [employer], [place], 6th year student in Chemistry Did You Have An Individualized Education Program (IIEP): No Did You Have Any Difficulty At [employer]?: No Patient's Education Has Been Impacted by Current Illness: No   CCA Family/Childhood History Family and Relationship History: Family history Marital status: Single Does patient have children?: Yes How many children?: 1 How is patient's relationship with their children?: good  Childhood History:  Childhood History By whom was/is the patient raised?: Mother, Father Did patient suffer any verbal/emotional/physical/sexual abuse as a child?: No Did patient suffer from severe childhood neglect?: No Has patient ever been sexually abused/assaulted/raped as an adolescent or adult?: No Was the patient ever a victim of a crime or a  disaster?: No Witnessed domestic violence?: No Has patient been affected by domestic violence as an adult?: No       CCA Substance Use Alcohol/Drug Use: Alcohol / Drug Use Pain Medications: see MAR Prescriptions: see MAR Over the Counter: see MAR History of alcohol / drug use?: Yes Longest period of sobriety (when/how long): uta Negative Consequences of Use: Personal relationships, Work / School Withdrawal Symptoms:  Industrial/product designer)    ASAM's:  Six Dimensions of Multidimensional Assessment  Dimension 1:  Acute Intoxication and/or Withdrawal Potential:   Dimension 1:  Description of individual's past and current experiences of substance use and withdrawal: Currently using delta 8  Dimension 2:  Biomedical Conditions and Complications:   Dimension 2:  Description of patient's biomedical conditions and  complications: None reported  Dimension 3:  Emotional, Behavioral, or Cognitive Conditions and Complications:  Dimension 3:  Description of emotional, behavioral, or cognitive conditions and complications: Bizarre behaviors and paranoia  Dimension 4:  Readiness to Change:  Dimension 4:  Description of Readiness to Change criteria: Patient placed under IVC  Dimension 5:  Relapse, Continued use, or Continued Problem Potential:  Dimension 5:  Relapse, continued use, or continued problem potential critiera description: Continued usage  Dimension 6:  Recovery/Living Environment:  Dimension 6:  Recovery/Iiving environment criteria description: Lives with parents  ASAM Severity Score: ASAM's Severity Rating Score: 8  ASAM Recommended Level of Treatment: ASAM Recommended Level of Treatment:  (uta)   Substance use Disorder (SUD) Substance Use Disorder (SUD)  Checklist Symptoms of Substance Use:  (uta)  Recommendations for Services/Supports/Treatments: Recommendations for Services/Supports/Treatments Recommendations For Services/Supports/Treatments: Individual Therapy, Inpatient Hospitalization,  Medication Management  Disposition Recommendation per psychiatric provider:  Recommends inpatient psychiatric treatment.    DSM5 Diagnoses: There are no active problems to display for this patient.    Referrals to Alternative Service(s): Referred to Alternative Service(s):   Place:   Date:   Time:    Referred to Alternative Service(s):   Place:   Date:   Time:    Referred to Alternative Service(s):   Place:   Date:   Time:    Referred to Alternative Service(s):   Place:   Date:   Time:     [place] JONETTA Childes, [facility]

## 2024-04-14 NOTE — ED Notes (Signed)
 [facility] Department called to arrange transport to [facility].

## 2024-04-14 NOTE — Discharge Instructions (Signed)
 Follow-up recommendations:  Activity:  Normal, as tolerated Diet:  Per PCP recommendation  Patient is instructed prior to discharge to:  Take all medications as prescribed by mental healthcare provider. Report any adverse effects and/or reactions from the medicines to outpatient provider promptly. To not engage in substance use while on psychiatric medicines.  In the event of worsening symptoms, patient is instructed to call the crisis hotline at 988, 911, or go to the nearest ED for appropriate evaluation and treatment of symptoms. To follow-up with primary care provider for your other medical issues, concerns and, or healthcare needs.

## 2024-04-14 NOTE — ED Notes (Addendum)
 Patient alert & oriented x4. Denies intent to harm self or others when asked. Denies A/VH. Patient reports pain in legs, patient unwilling to give pain score for pain, rating a hurts a little bit on the FACES pain scale, patient refused pain medication at this time. Patient refused scheduled medication stating  I don't want Lamictal. Provider Justino Cornish, MD made aware. Patient is under IVC status at this time and set to transfer to [facility]. Currently awaiting [facility] Department for transport services. No acute distress noted. Support and encouragement provided. Routine safety checks conducted per facility protocol. Encouraged patient to notify staff if any thoughts of harm towards self or others arise. Patient nodded head in understanding and agreement.

## 2024-04-14 NOTE — ED Notes (Signed)
 Pt currently sleeping. No distress or discomfort noted. Focused plan pending re-evaluation today. Will continue to monitor and report changes as noted. Safety maintained.

## 2024-04-14 NOTE — ED Notes (Addendum)
 Pt admitted to [facility] and is IVC. Pt arrived today with his mother in a manic state and complaints of insomnia. He reports trouble sleeping times 2 weeks. He is Diagnosed with Bipolar with Manic Episodes. During assessment he was Guarded and suspect. speech was pressured and loud.  He was Rambling about scientific and Water quality scientist. He was refusing to take prescribed medication Zyprexa 10mg  PO thatt was ordered.  He yelled extremely loud and slammed table with hands in exam room  when he was told that his mother had left and he would be staying.  He was offered IM PRN medication or PO medication to control his behavior and agitation, he accepted PO med Zyprexa and 50mg  PO Benadryl PRN and it was effective. Pt cooperative with the rest of admission process and skin check.  No contraband. Pt UDS positive Amphetamine,THC,Cocaine and Meth.  Pt was oriented to unit and offered meal and fluids with acceptance. Pt did verbally contract to safety before then laying down and falling asleep. Currently he is breathing easy, no distress. Will continue to monitor and report changes as noted.

## 2024-04-14 NOTE — Progress Notes (Signed)
 LCSW Progress Note  [id number]   SHADRACK BRUMMITT  04/14/2024  9:25 AM  Description:   Inpatient Psychiatric Referral  Patient was recommended inpatient per Dr. Cornelius MD. There are no available beds at [facility], per [facility] RN. Patient was referred to the following out of network facilities:   Destination  Service Provider Address Phone Fax  [facility]  [address] [place] [address] [phone]  [facility]  [address] [place] [address] [phone]  [facility]  [address], [place] [address] [phone]  [facility]  [address] [place] [address] [phone]  [facility]  [address] [place] [address] [phone]  [facility] EFAX  [address] [place] [phone]      Situation ongoing, CSW to continue following and update chart as more information becomes available.      [place] Raidon Swanner, MSW, LCSW  04/14/2024 9:25 AM

## 2024-04-14 NOTE — ED Notes (Signed)
 Patient discharged to [facility]. Patient irritable but non-combative. Escorted to back sallyport via staff, safety maintained. Patient belongings returned from locker 27 complete and intact. Safety maintained.

## 2024-04-14 NOTE — Progress Notes (Signed)
 Pt has been accepted to [facility] on 04/14/2024 Bed assignment: 3 WEST   Pt meets inpatient criteria per: Dr. Cornelius   Attending Physician will be: Dr. Ilsa   Report can be called to: [phone]  Pt can arrive ASAP   Care Team Notified: Justino Cornelius MD, [facility] Ward RN, Teddie Potters MD   [place] Cecilio Ohlrich LCSW-A   04/14/2024 9:39 AM

## 2024-04-14 NOTE — ED Notes (Signed)
 Patient resting in lounger with eyes closed, respirations even and unlabored. Patient in no apparent acute distress. Environment secured. Safety checks in place per facility protocol.

## 2024-04-14 NOTE — ED Provider Notes (Signed)
 FBC/OBS ASAP Discharge Summary  Date and Time: 04/14/2024 11:21 AM  Name: Timothy Figueroa  MRN:  [id number]   Discharge Diagnoses:  Final diagnoses:  Bipolar affective disorder, currently manic, severe, with psychotic features [facility])    HPI: Timothy Figueroa is a 28 y.o. male w/ past hx of bipolar disorder who presents with delusions, bizarre behavioral, and agitation per family. See H&P for details about HPI.    Subjective:  pt is minimizing symptoms. Reports having had delusions but they are better. Reports that he is unsure about weight loss. 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 Reports that he is unsure if he has had presentations similar to this in the past. Does not comment on his sleep recently. Reports that he wants to leave. Reports that he has not been adherent to medications but does not clarify why. Reports that he does not want inpatient and does not want to safety plan home either with medications. Discussed that he would likely need hospital involuntarily if he does not consent. Denies any withdrawal from alcohol or other substances at this time.   Collateral, father, [phone]: called to get any information that this collateral would have but did not disclose pt's name or any other HIPAA information. reports that he is having delusions about mega rays. Reported that he had called the police b/c of concern for some scanner. Reports that he is becoming more and more agitated and more and more detatched from reality. Reports that he is not obviously drinking. Reports that he is diagnosed with bipolar. Reports that son is getting his PhD in chemistry and father had to come get him from work. Reports that he has a daughter.  Stay Summary: started on olanzapine 10 at bedtime and was able to sleep some. Was minimizing symptoms so continued IVC. Minimizing symptoms. Upset about involuntary admission.   Total Time spent with patient: 1 hour  Past Psychiatric  History: Bipolar disorder dx per family. past med trials of cariprazine, depakote, abilify, lexapro. Current meds are olanzapine 10 and lamotrigine 25 which he is not taking. No past psychiatric hospitalization per patient or chart review.  Past Medical History: pt denies dx, meds, TBI, seizures. Pt reports sulfa and penicillin allergy.  Family History: no pertinent Family Psychiatric  History: no pertinent Social History: lives with family. Getting PhD in chemistry. Lives in [place]. Has a young daughter.  Tobacco Cessation:  N/A, patient does not currently use tobacco products  Current Medications:  Current Facility-Administered Medications  Medication Dose Route Frequency Provider Last Rate Last Admin   acetaminophen (TYLENOL) tablet 650 mg  650 mg Oral Q6H PRN Bobbitt, Shalon E, NP       alum & mag hydroxide-simeth (MAALOX/MYLANTA) 200-200-20 MG/5ML suspension 30 mL  30 mL Oral Q4H PRN Bobbitt, Shalon E, NP       haloperidol (HALDOL) tablet 5 mg  5 mg Oral TID PRN Bobbitt, Shalon E, NP       And   diphenhydrAMINE (BENADRYL) capsule 50 mg  50 mg Oral TID PRN Bobbitt, Shalon E, NP       haloperidol lactate (HALDOL) injection 5 mg  5 mg Intramuscular TID PRN Bobbitt, Shalon E, NP       And   diphenhydrAMINE (BENADRYL) injection 50 mg  50 mg Intramuscular TID PRN Bobbitt, Shalon E, NP       And   LORazepam (ATIVAN) injection 2 mg  2 mg Intramuscular TID PRN Bobbitt, Shalon E,  NP       haloperidol lactate (HALDOL) injection 10 mg  10 mg Intramuscular TID PRN Bobbitt, Shalon E, NP       And   diphenhydrAMINE (BENADRYL) injection 50 mg  50 mg Intramuscular TID PRN Bobbitt, Shalon E, NP       And   LORazepam (ATIVAN) injection 2 mg  2 mg Intramuscular TID PRN Bobbitt, Shalon E, NP       hydrOXYzine (ATARAX) tablet 25 mg  25 mg Oral TID PRN Bobbitt, Shalon E, NP       lamoTRIgine (LAMICTAL) tablet 25 mg  25 mg Oral Daily Bobbitt, Shalon E, NP       magnesium hydroxide (MILK OF MAGNESIA)  suspension 30 mL  30 mL Oral Daily PRN Bobbitt, Shalon E, NP       OLANZapine (ZYPREXA) tablet 10 mg  10 mg Oral QHS Bobbitt, Shalon E, NP   10 mg at 04/14/24 0109   traZODone (DESYREL) tablet 50 mg  50 mg Oral QHS PRN Bobbitt, Shalon E, NP       Current Outpatient Medications  Medication Sig Dispense Refill   clindamycin-benzoyl peroxide (BENZACLIN) gel Apply 1 Application topically 2 (two) times daily.     OLANZapine (ZYPREXA) 10 MG tablet Take 10 mg by mouth at bedtime.     tretinoin (RETIN-A) 0.05 % cream Apply 1 Application topically at bedtime.     lamoTRIgine (LAMICTAL) 25 MG tablet Take 25 mg by mouth daily. (Patient not taking: Reported on 04/14/2024)      PTA Medications:  Facility Ordered Medications  Medication   acetaminophen (TYLENOL) tablet 650 mg   alum & mag hydroxide-simeth (MAALOX/MYLANTA) 200-200-20 MG/5ML suspension 30 mL   magnesium hydroxide (MILK OF MAGNESIA) suspension 30 mL   haloperidol (HALDOL) tablet 5 mg   And   diphenhydrAMINE (BENADRYL) capsule 50 mg   haloperidol lactate (HALDOL) injection 5 mg   And   diphenhydrAMINE (BENADRYL) injection 50 mg   And   LORazepam (ATIVAN) injection 2 mg   haloperidol lactate (HALDOL) injection 10 mg   And   diphenhydrAMINE (BENADRYL) injection 50 mg   And   LORazepam (ATIVAN) injection 2 mg   traZODone (DESYREL) tablet 50 mg   hydrOXYzine (ATARAX) tablet 25 mg   OLANZapine (ZYPREXA) tablet 10 mg   lamoTRIgine (LAMICTAL) tablet 25 mg   PTA Medications  Medication Sig   OLANZapine (ZYPREXA) 10 MG tablet Take 10 mg by mouth at bedtime.   clindamycin-benzoyl peroxide (BENZACLIN) gel Apply 1 Application topically 2 (two) times daily.   tretinoin (RETIN-A) 0.05 % cream Apply 1 Application topically at bedtime.   lamoTRIgine (LAMICTAL) 25 MG tablet Take 25 mg by mouth daily. (Patient not taking: Reported on 04/14/2024)        No data to display          Flowsheet Row ED from 04/13/2024 in [facility] UC from 10/11/2023 in [facility] [place] [facility] from 09/07/2023 in [facility] [place] [facility])  C-SSRS RISK CATEGORY No Risk No Risk No Risk    Musculoskeletal  Strength & Muscle Tone: within normal limits Gait & Station: normal Patient leans: N/A  Psychiatric Specialty Exam  Presentation  General Appearance:  Casual  Eye Contact: Fleeting  Speech: Clear and Coherent  Speech Volume: Normal  Handedness: Right   Mood and Affect  Mood: Labile  Affect: Labile   Thought Process  Thought Processes: Disorganized  Descriptions of Associations:Loose  Orientation:Partial  Thought Content:Delusions  Diagnosis of Schizophrenia or Schizoaffective disorder in past: No  Duration of Psychotic Symptoms: Less than six months   Hallucinations:Hallucinations: Auditory Description of Auditory Hallucinations: Hears AI voice talking to him  Ideas of Reference:Delusions  Suicidal Thoughts:Suicidal Thoughts: No  Homicidal Thoughts:Homicidal Thoughts: No   Sensorium  Memory: Immediate Fair; Remote Fair  Judgment: Poor  Insight: Poor   Executive Functions  Concentration: Poor  Attention Span: Poor  Recall: Fair  Fund of Knowledge: Fair  Language: Fair   Psychomotor Activity  Psychomotor Activity: Psychomotor Activity: Normal   Assets  Assets: Manufacturing systems engineer; Housing; Vocational/Educational; Financial Resources/Insurance   Sleep  Sleep: Sleep: Poor  No Safety Checks orders active in given range  Nutritional Assessment (For OBS and FBC admissions only) Has the patient had a weight loss or gain of 10 pounds or more in the last 3 months?: No Has the patient had a decrease in food intake/or appetite?: No Does the patient have dental problems?: No Does the patient have eating habits or behaviors that may be indicators of an eating disorder including binging or inducing  vomiting?: No Has the patient recently lost weight without trying?: 1 Has the patient been eating poorly because of a decreased appetite?: 1 Malnutrition Screening Tool Score: 2    Physical Exam  Physical Exam Vitals and nursing note reviewed.  Constitutional:      General: He is not in acute distress.    Appearance: He is well-developed.  HENT:     Head: Normocephalic and atraumatic.  Pulmonary:     Effort: Pulmonary effort is normal. No respiratory distress.   Musculoskeletal:        General: No swelling.   Neurological:     General: No focal deficit present.     Mental Status: He is alert.    Review of Systems  Constitutional:  Negative for fever.  Cardiovascular:  Negative for chest pain and palpitations.  Gastrointestinal:  Negative for constipation, diarrhea, nausea and vomiting.  Neurological:  Negative for dizziness, weakness and headaches.   Blood pressure 107/61, pulse 67, temperature 98.4 F (36.9 C), resp. rate 18, SpO2 100%. There is no height or weight on file to calculate BMI.  Demographic Factors:  Male and Adolescent or young adult  Loss Factors: NA  Historical Factors: Impulsivity  Risk Reduction Factors:   Employed, Living with another person, especially a relative, and Positive social support  Continued Clinical Symptoms:  Mania with psychotics features. No depressive symptoms.   Cognitive Features That Contribute To Risk:  Polarized thinking    Suicide Risk:  Minimal: No identifiable suicidal ideation.  Patients presenting with no risk factors but with morbid ruminations; may be classified as minimal risk based on the severity of the depressive symptoms  Plan Of Care/Follow-up recommendations:  Continue olanzapine 10 mg at night and lamotragine 25 mg once daily.   Disposition: [facility] Justino Cornish, MD PGY-1 Psychiatry Resident 04/14/2024, 11:21 AM

## 2024-04-14 NOTE — ED Notes (Signed)
 Writer called [facility] to give report. Was informed staff is in the middle of a round and to call back in 5 minutes. Writer given unit specific phone number.

## 2024-04-14 NOTE — ED Provider Notes (Signed)
 [facility] Continuous Assessment Admission H&P  Date: 04/14/24 Patient Name: Timothy Figueroa MRN: [id number] Chief Complaint: bizarre behavior  Diagnoses:  Final diagnoses:  Bipolar affective disorder, currently manic, severe, with psychotic features (HCC)   Timothy Figueroa presented to [facility] as a walk in accompanied by his mother Timothy Figueroa with complaints of worsening bizarre behaviors.   HPI: Timothy Figueroa is a 28 y/o male presenting to [facility] as a walk in accompanied by his mother Timothy Figueroa [phone] who was concerned about patient increasing bizarre behaviors, smoking delta 9, not sleeping and made the statement that Timothy Figueroa wanted him to commit suicide and requested his mother bring him to the hospital.  Timothy Figueroa reports that patient had an outburst at work on Friday, and her husband had to pick him from work.  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 Timothy Figueroa reported that her son Timothy Figueroa has been diagnosed with bipolar disorder but is not taking his prescribed medications. Timothy Figueroa reports that patient stays up most nights playing video games. Mother reports that patient has had a poor appetite and has lost about 30-40 in the last 7-8 months. During my evaluation Timothy Figueroa stated "AI" was communicating with him directly into his brain and that AI was in love with him. Patient stated that the reason he came to [facility] tonight was because it was almost to the end of the movie and it was getting to a resolution. Patient stated that he has a history of alcoholism and was hospitalized in 2015 but mother reports that he has not observed any drinking in several months. Patient is very bizarre and tangential, rambling about space, time and quantum. Patient stated that he was hearing signs from the earth.  Patient endorsed that he has been smoking delta 9 and smoked today. Timothy Figueroa reports that on Friday when he went to work there were more  people around than usual which seemed odd to him. Patient then states the confidence interval did not support that distribution of people. Patient states that he is being followed by Timothy Figueroa at the [facility] in [place]. Patient's mother had patient's medication bottles zyprexa and lamictal which patient has not been taking. Patient stated he does not want to take medications because of all the the side effects associated with the medications  During evaluation Timothy Figueroa is sitting in the assessment room in no acute distress but appears agitated and restless. He is alert, oriented x 3,  mood is labile  with congruent affect. He has rambling, tangential, pressured speech, and behavior. Patient endorses auditory hallucination, hearing AI talk to him.  Patient appears distracted, or pre-occupied and turns his head in the direction of the wall as if he is responding some internal or external stimuli.  He denies suicidal/self-harm/homicidal ideation, psychosis, and paranoia.    Patient meets criteria for [place]  involuntary commitment due to his unwillingness to take his prescribed psychiatric medication, receive appropriate treatment, and lacking insight into current medical condition.Without immediate treatment, there is a high likelihood of further escalation of violence or self-injurious behavior due to sleep deprivation, substance use, and mental instability. Immediate psychiatric evaluation, stabilization, and treatment are necessary to prevent harm and restore the patient's capacity for safe functioning. IVC with 1st exam completed on patient.   Patient recommended for inpatient treatment and will be admitted to [facility] until an inpatient bed can  be identified.   Total Time spent with patient: 20 minutes  Musculoskeletal  Strength & Muscle Tone: within normal limits Gait & Station: normal Patient leans: N/A  Psychiatric Specialty Exam  Presentation General  Appearance:  Casual  Eye Contact: Fleeting  Speech: Clear and Coherent  Speech Volume: Normal  Handedness: Right   Mood and Affect  Mood: Labile  Affect: Labile   Thought Process  Thought Processes: Disorganized  Descriptions of Associations:Loose  Orientation:Partial  Thought Content:Delusions  Diagnosis of Schizophrenia or Schizoaffective disorder in past: No  Duration of Psychotic Symptoms: Greater than six months  Hallucinations:Hallucinations: Auditory Description of Auditory Hallucinations: Hears AI voice talking to him  Ideas of Reference:Delusions  Suicidal Thoughts:Suicidal Thoughts: No  Homicidal Thoughts:Homicidal Thoughts: No   Sensorium  Memory: Immediate Fair; Remote Fair  Judgment: Poor  Insight: Poor   Executive Functions  Concentration: Poor  Attention Span: Poor  Recall: Fair  Fund of Knowledge: Fair  Language: Fair   Psychomotor Activity  Psychomotor Activity: Psychomotor Activity: Normal   Assets  Assets: Manufacturing systems engineer; Housing; Vocational/Educational; Financial Resources/Insurance   Sleep  Sleep: Sleep: Poor Number of Hours of Sleep: 3   Nutritional Assessment (For OBS and FBC admissions only) Has the patient had a weight loss or gain of 10 pounds or more in the last 3 months?: No Has the patient had a decrease in food intake/or appetite?: No Does the patient have dental problems?: No Does the patient have eating habits or behaviors that may be indicators of an eating disorder including binging or inducing vomiting?: No Has the patient recently lost weight without trying?: 1 Has the patient been eating poorly because of a decreased appetite?: 1 Malnutrition Screening Tool Score: 2    Physical Exam HENT:     Head: Normocephalic.     Nose: Nose normal.   Eyes:     Pupils: Pupils are equal, round, and reactive to light.    Cardiovascular:     Rate and Rhythm: Normal rate.   Pulmonary:     Effort: Pulmonary effort is normal.  Abdominal:     General: Abdomen is flat.   Musculoskeletal:        General: Normal range of motion.   Skin:    General: Skin is warm.   Neurological:     Mental Status: He is alert and oriented to person, place, and time.   Psychiatric:        Attention and Perception: He perceives auditory hallucinations.        Mood and Affect: Affect is labile and angry.        Speech: Speech is rapid and pressured.        Behavior: Behavior is agitated and actively hallucinating.        Thought Content: Thought content is delusional.        Judgment: Judgment is impulsive.   Review of Systems  Constitutional: Negative.   HENT: Negative.    Eyes: Negative.   Respiratory: Negative.    Cardiovascular: Negative.   Gastrointestinal: Negative.   Genitourinary: Negative.   Musculoskeletal: Negative.   Skin: Negative.   Neurological: Negative.   Endo/Heme/Allergies: Negative.   Psychiatric/Behavioral:  Positive for hallucinations.     Blood pressure (!) 128/92, pulse 81, temperature 98.3 F (36.8 C), temperature source Oral, resp. rate 15, SpO2 100%. There is no height or weight on file to calculate BMI.  Past Psychiatric History: Bipolar Disorder, Alcoholism  Is the patient at risk to self? Yes  Has the patient been a risk to self in the past 6 months? No .    Has the patient been a risk to self within the distant past? No   Is the patient a risk to others? No   Has the patient been a risk to others in the past 6 months? No   Has the patient been a risk to others within the distant past? 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  Latest known visit with  results is:  Admission on 09/07/2023, Discharged on 09/07/2023  Component Date Value Ref Range Status   RPR Ser Ql 09/07/2023 Non Reactive  Non Reactive Final   HIV Screen 4th Generation wRfx 09/07/2023 Non Reactive  Non Reactive Final   Comment: HIV-1/HIV-2 antibodies and HIV-1 p24 antigen were NOT detected. There is no laboratory evidence of HIV infection. HIV Negative    Neisseria Gonorrhea 09/07/2023 Negative   Final   Chlamydia 09/07/2023 Negative   Final   Trichomonas 09/07/2023 Negative   Final   Comment 09/07/2023 Normal Reference Ranger Chlamydia - Negative   Final   Comment 09/07/2023 Normal Reference Range Neisseria Gonorrhea - Negative   Final   Comment 09/07/2023 Normal Reference Range Trichomonas - Negative   Final    Allergies: Penicillins and Sulfa antibiotics  Medications:  Facility Ordered Medications  Medication   acetaminophen (TYLENOL) tablet 650 mg   alum & mag hydroxide-simeth (MAALOX/MYLANTA) 200-200-20 MG/5ML suspension 30 mL   magnesium hydroxide (MILK OF MAGNESIA) suspension 30 mL   haloperidol (HALDOL) tablet 5 mg   And   diphenhydrAMINE (BENADRYL) capsule 50 mg   haloperidol lactate (HALDOL) injection 5 mg   And   diphenhydrAMINE (BENADRYL) injection 50 mg   And   LORazepam (ATIVAN) injection 2 mg   haloperidol lactate (HALDOL) injection 10 mg   And   diphenhydrAMINE (BENADRYL) injection 50 mg   And   LORazepam (ATIVAN) injection 2 mg   traZODone (DESYREL) tablet 50 mg   hydrOXYzine (ATARAX) tablet 25 mg   OLANZapine (ZYPREXA) tablet 10 mg   lamoTRIgine (LAMICTAL) tablet 25 mg   PTA Medications  Medication Sig   Multiple Vitamin (MULTIVITAMIN) tablet Take 1 tablet by mouth daily.   LACTASE PO Take 1 tablet by mouth daily.   acetaminophen (TYLENOL) 500 MG tablet Take 500 mg by mouth every 6 (six) hours as needed for mild pain.   Carboxymethylcellulose Sodium (REFRESH LIQUIGEL OP) Place 1 drop into both eyes daily as needed (dry eyes).    Naphazoline HCl (CLEAR EYES OP) Place 1 drop into both eyes daily as needed (red eyes).   chlordiazePOXIDE  (LIBRIUM ) 25 MG capsule 50mg  PO TID x 1D, then 25-50mg  PO BID X 1D, then 25-50mg  PO QD X 1D   VRAYLAR 1.5 MG capsule Take 1.5 mg by mouth daily.   escitalopram (LEXAPRO) 10 MG tablet Take 5 mg by mouth.      Medical Decision Making  Timothy Figueroa is a 28 y/o male presenting to [facility] as a walk in accompanied by his mother Timothy Figueroa [phone] who was concerned about patient increasing bizarre behaviors, smoking delta 9, not sleeping and made the statement that Timothy Figueroa wanted him to commit  suicide and requested his mother bring him to the hospital.  Timothy Figueroa reports that patient had an outburst at work on Friday, and her husband had to pick him from work.  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 Mrs. Hartline reported that her son Balin has been diagnosed with bipolar disorder but is not taking his prescribed medications. Mrs. Rodriquez reports that patient stays up most nights playing video games. Mother reports that patient has had a poor appetite and has lost about 30-40 in the last 7-8 months.    Recommendations  Based on my evaluation the patient does not appear to have an emergency medical condition. Patient recommended for inpatient treatment and will be admitted to [facility] until an inpatient bed can be identified.   Saige Busby E Anup Brigham, NP 04/14/24  12:24 AM

## 2024-04-21 ENCOUNTER — Telehealth: Admitting: Physician Assistant

## 2024-04-21 DIAGNOSIS — B353 Tinea pedis: Secondary | ICD-10-CM | POA: Diagnosis not present

## 2024-04-21 NOTE — Progress Notes (Signed)
 Virtual Visit Consent   Timothy Figueroa, you are scheduled for a virtual visit with a [facility] provider today. Just as with appointments in the office, your consent must be obtained to participate. Your consent will be active for this visit and any virtual visit you may have with one of our providers in the next 365 days. If you have a MyChart account, a copy of this consent can be sent to you electronically.  As this is a virtual visit, video technology does not allow for your provider to perform a traditional examination. This may limit your provider's ability to fully assess your condition. If your provider identifies any concerns that need to be evaluated in person or the need to arrange testing (such as labs, EKG, etc.), we will make arrangements to do so. Although advances in technology are sophisticated, we cannot ensure that it will always work on either your end or our end. If the connection with a video visit is poor, the visit may have to be switched to a telephone visit. With either a video or telephone visit, we are not always able to ensure that we have a secure connection.  By engaging in this virtual visit, you consent to the provision of healthcare and authorize for your insurance to be billed (if applicable) for the services provided during this visit. Depending on your insurance coverage, you may receive a charge related to this service.  I need to obtain your verbal consent now. Are you willing to proceed with your visit today? Timothy Figueroa has provided verbal consent on 04/21/2024 for a virtual visit (video or telephone). Delon CHRISTELLA Dickinson, PA-C  Date: 04/21/2024 3:21 PM   Virtual Visit via Video Note   I, Delon CHRISTELLA Dickinson, connected with  Timothy Figueroa  ([id number], [birth year]) on 04/21/24 at  2:45 PM EDT by a video-enabled telemedicine application and verified that I am speaking with the correct person using two identifiers.  Location: Patient: Virtual Visit  Location Patient: Home Provider: Virtual Visit Location Provider: Home Office   I discussed the limitations of evaluation and management by telemedicine and the availability of in person appointments. The patient expressed understanding and agreed to proceed.    History of Present Illness: Timothy Figueroa is a 28 y.o. who identifies as a male who was assigned male at birth, and is being seen today for rash and swelling of the right foot. 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 He was started on new medications from an inpatient psychiatric stay at [facility]. Previously had been on Zyprexa  10mg  and Lamotrigine  25mg . Reports not taking Lamotrigine . Unsure of new medication. He is currently at the beach in [place] on vacation with friends and noticed that his right foot was feeling hot in his shoe. 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 He also feels the bottom of his foot is swollen.   Problems: There are no active problems to display for this patient.   Allergies:  Allergies  Allergen Reactions   Penicillins Hives   Sulfa Antibiotics Hives   Medications:  Current Outpatient Medications:    clindamycin-benzoyl peroxide (BENZACLIN) gel, Apply 1 Application topically 2 (two) times daily., Disp: , Rfl:    lamoTRIgine  (LAMICTAL ) 25 MG tablet, Take 25 mg by mouth daily. (Patient not taking: Reported on  04/14/2024), Disp: , Rfl:    OLANZapine  (ZYPREXA ) 10 MG tablet, Take 10 mg by mouth at bedtime., Disp: , Rfl:    tretinoin (RETIN-A) 0.05 % cream, Apply 1 Application topically at bedtime., Disp: , Rfl:   Observations/Objective: Patient is well-developed, well-nourished in no acute distress.  Resting comfortably at home.  Head is normocephalic, atraumatic.  No labored breathing.  Speech is clear and coherent with logical content.  Patient is alert and oriented at  baseline.  Small annular patches with scaling edges noted on the sole of the right foot all over suspected to be athlete's foot, not SJS. There are no vesicular lesions or sloughing skin noted  Assessment and Plan: There are no diagnoses linked to this encounter. - Did advise patient that I felt this was not SJS but Athlete's foot - He feels from new medication and desires a trial of stopping to see if improves - Provider advised that they did not recommend stopping and if he truly had vesicular lesions that were painful and sloughing of skin he should seek immediate medical care at the closest ER - He declines and states I am on vacation and trying to avoid the ER here as it is the week of the 4th and probably busy.  - Advised again that I did not recommend him stopping his medications, but he insist that he wants Provider approval to do a trial of stopping his medication for one night to see if it improves - I did advise again that I did not feel this was from any medication and most likely swelling and burning was from the hot sand and being in the sun with the athlete's foot - However, as patient pushed back again, I did advise he could stop the medication for one day to see if it improves - If it does improve, he can remain off until he sees his provider on 04/29/24 - If it does not improve, he is to start his medications back and can seek out athlete foot treatment - If it worsens and develops sloughing of skin he is to seek immediate medical care at the closest ER  Follow Up Instructions: I discussed the assessment and treatment plan with the patient. The patient was provided an opportunity to ask questions and all were answered. The patient agreed with the plan and demonstrated an understanding of the instructions.  A copy of instructions were sent to the patient via MyChart unless otherwise noted below.    The patient was advised to call back or seek an in-person evaluation if the  symptoms worsen or if the condition fails to improve as anticipated.    Delon CHRISTELLA Dickinson, PA-C

## 2024-04-21 NOTE — Patient Instructions (Signed)
 Timothy Figueroa, thank you for joining Timothy CHRISTELLA Dickinson, PA-C for today's virtual visit.  While this provider is not your primary care provider (PCP), if your PCP is located in our provider database this encounter information will be shared with them immediately following your visit.   A [facility] MyChart account gives you access to today's visit and all your visits, tests, and labs performed at [facility]  click here if you don't have a [facility] MyChart account or go to [email or web address]  Consent: (Patient) Timothy Figueroa provided verbal consent for this virtual visit at the beginning of the encounter.  Current Medications:  Current Outpatient Medications:    clindamycin-benzoyl peroxide (BENZACLIN) gel, Apply 1 Application topically 2 (two) times daily., Disp: , Rfl:    lamoTRIgine  (LAMICTAL ) 25 MG tablet, Take 25 mg by mouth daily. (Patient not taking: Reported on 04/14/2024), Disp: , Rfl:    OLANZapine  (ZYPREXA ) 10 MG tablet, Take 10 mg by mouth at bedtime., Disp: , Rfl:    tretinoin (RETIN-A) 0.05 % cream, Apply 1 Application topically at bedtime., Disp: , Rfl:    Medications ordered in this encounter:  No orders of the defined types were placed in this encounter.    *If you need refills on other medications prior to your next appointment, please contact your pharmacy*  Follow-Up: Call back or seek an in-person evaluation if the symptoms worsen or if the condition fails to improve as anticipated.  [facility] [phone]  Other Instructions Athlete's Foot Athlete's foot (tinea pedis) is a fungal infection of the skin on your feet. It often occurs on the skin that is between or underneath the toes. It can also occur on the soles of your feet. The infection can spread from person to person (is contagious). It can also spread when a person's bare feet come in contact with the fungus on shower floors or on items such as shoes. What are the  causes? This condition is caused by a fungus that grows in warm, moist places. You can get athlete's foot by sharing shoes, shower stalls, towels, and wet floors with someone who is infected. Not washing your feet or changing your socks often enough can also lead to athlete's foot. What increases the risk? This condition is more likely to develop in: Men. People who have a weak body defense system (immune system). People who have diabetes. People who use public showers, such as at a gym. People who wear heavy-duty shoes, such as Youth worker. Seasons with warm, humid weather. What are the signs or symptoms? Symptoms of this condition include: Itchy areas between your toes or on the soles of your feet. White, flaky, or scaly areas between your toes or on the soles of your feet. Very itchy small blisters between your toes or on the soles of your feet. Small cuts in your skin. These cuts can become infected. Thick or discolored toenails. How is this diagnosed? This condition may be diagnosed with a physical exam and a review of your medical history. Your health care provider may also take a skin or toenail sample to examine under a microscope. How is this treated? This condition is treated with antifungal medicines. These may be applied as powders, ointments, or creams. In severe cases, an oral antifungal medicine may be given. Follow these instructions at home: Medicines Apply or take over-the-counter and prescription medicines only as told by your health care provider. Apply your antifungal medicine as told by  your health care provider. Do not stop using the antifungal even if your condition improves. Foot care Do not scratch your feet. Keep your feet dry: Wear cotton or wool socks. Change your socks every day or if they become wet. Wear shoes that allow air to flow, such as sandals or canvas tennis shoes. Wash and dry your feet, including the area between your toes.  Also, wash and dry your feet: Every day or as told by your health care provider. After exercising. General instructions Do not let others use towels, shoes, nail clippers, or other personal items that touch your feet. Protect your feet by wearing sandals in wet areas, such as locker rooms and shared showers. Keep all follow-up visits. This is important. If you have diabetes, keep your blood sugar under control. Contact a health care provider if: You have a fever. You have swelling, soreness, warmth, or redness in your foot. Your feet are not getting better with treatment. Your symptoms get worse. You have new symptoms. You have severe pain. Summary Athlete's foot (tinea pedis) is a fungal infection of the skin on your feet. It often occurs on skin that is between or underneath the toes. This condition is caused by a fungus that grows in warm, moist places. Symptoms include white, flaky, or scaly areas between your toes or on the soles of your feet. This condition is treated with antifungal medicines. Keep your feet clean. Always dry them thoroughly. This information is not intended to replace advice given to you by your health care provider. Make sure you discuss any questions you have with your health care provider. Document Revised: 01/30/2021 Document Reviewed: 01/30/2021 Elsevier Patient Education  2024 Elsevier Inc.   If you have been instructed to have an in-person evaluation today at a local Urgent Care facility, please use the link below. It will take you to a list of all of our available [facility], including address, phone number and hours of operation. Please do not delay care.  [facility]  If you or a family member do not have a primary care provider, use the link below to schedule a visit and establish care. When you choose a [facility] primary care physician or advanced practice provider, you gain a long-term partner in health. Find a Primary  Care Provider  Learn more about [facility]'s in-office and virtual care options: [facility] - Get Care Now

## 2024-06-23 ENCOUNTER — Ambulatory Visit
Admission: EM | Admit: 2024-06-23 | Discharge: 2024-06-23 | Disposition: A | Discharge status: Other Acute Inpatient Hospital | Attending: Family Medicine | Admitting: Family Medicine

## 2024-06-23 ENCOUNTER — Other Ambulatory Visit: Payer: Self-pay

## 2024-06-23 DIAGNOSIS — F10929 Alcohol use, unspecified with intoxication, unspecified: Secondary | ICD-10-CM | POA: Diagnosis not present

## 2024-06-23 DIAGNOSIS — S0990XA Unspecified injury of head, initial encounter: Secondary | ICD-10-CM

## 2024-06-23 DIAGNOSIS — W19XXXA Unspecified fall, initial encounter: Secondary | ICD-10-CM | POA: Diagnosis not present

## 2024-06-23 DIAGNOSIS — S01111A Laceration without foreign body of right eyelid and periocular area, initial encounter: Secondary | ICD-10-CM | POA: Diagnosis not present

## 2024-06-23 NOTE — ED Notes (Signed)
 Patient is being discharged from the Urgent Care and sent to the Emergency Department via POV . Per Myla Bold, NP, patient is in need of higher level of care due to r/o head trauma. Patient is aware and verbalizes understanding of plan of care.  Vitals:   06/23/24 1530  BP: 117/75  Pulse: 82  Resp: 16  Temp: (!) 97.5 F (36.4 C)  SpO2: 98%

## 2024-06-23 NOTE — ED Provider Notes (Signed)
 [facility]    CSN: [id number] Arrival date & time: 06/23/24  1519      History   Chief Complaint No chief complaint on file.   HPI Timothy Figueroa is a 28 y.o. male presents for head injury.  He was dropped off by a friend.  Patient reports 6 to 8 hours ago he fell hitting his head on a wooden table.  He states this was witnessed and he denies LOC.  Patient is obviously  intoxicated stating he has been drinking 6 to 8 glasses of liquor since the fall and was drinking prior to the fall as well.  He does endorse headache, neck pain, nausea.  He denies any vomiting but cannot identify he is having visual changes.  He does have pain around the eye with eye movement.  He has a lack to his right eyebrow and bruising to his lower left orbital area.  He is up-to-date on his tetanus.  No other concerns at this time.  HPI  Past Medical History:  Diagnosis Date   Allergy    Asthma     There are no active problems to display for this patient.   History reviewed. No pertinent surgical history.     Home Medications    Prior to Admission medications   Medication Sig Start Date End Date Taking? Authorizing Provider  clindamycin-benzoyl peroxide (BENZACLIN) gel Apply 1 Application topically 2 (two) times daily. 03/14/24   [provider]  lamoTRIgine  (LAMICTAL ) 25 MG tablet Take 25 mg by mouth daily. Patient not taking: Reported on 04/14/2024 03/12/24   [provider]  OLANZapine  (ZYPREXA ) 10 MG tablet Take 10 mg by mouth at bedtime. 03/26/24   [provider]  tretinoin (RETIN-A) 0.05 % cream Apply 1 Application topically at bedtime. 03/10/24   [provider]    Family History Family History  Problem Relation Age of Onset   Healthy Mother    Hypertension Father    Cancer Paternal Grandfather     Social History Social History   Tobacco Use   Smoking status: Every Day    Types: Cigarettes   Smokeless tobacco: Current    Types: Chew   Vaping Use   Vaping status: Never Used  Substance Use Topics   Alcohol use: Yes    Comment: 1/2 vodka past 2 days   Drug use: Yes    Types: Marijuana    Comment: daily     Allergies   Penicillins and Sulfa antibiotics   Review of Systems Review of Systems  Gastrointestinal:  Positive for nausea.  Skin:  Positive for wound.  Neurological:  Positive for headaches.     Physical Exam Triage Vital Signs ED Triage Vitals  Encounter Vitals Group     BP 06/23/24 1530 117/75     Girls Systolic BP Percentile --      Girls Diastolic BP Percentile --      Boys Systolic BP Percentile --      Boys Diastolic BP Percentile --      Pulse Rate 06/23/24 1530 82     Resp 06/23/24 1530 16     Temp 06/23/24 1530 (!) 97.5 F (36.4 C)     Temp Source 06/23/24 1530 Oral     SpO2 06/23/24 1530 98 %     Weight --      Height --      Head Circumference --      Peak Flow --  Pain Score 06/23/24 1529 7     Pain Loc --      Pain Education --      Exclude from Growth Chart --    No data found.  Updated Vital Signs BP 117/75   Pulse 82   Temp (!) 97.5 F (36.4 C) (Oral)   Resp 16   SpO2 98%   Visual Acuity Right Eye Distance:   Left Eye Distance:   Bilateral Distance:    Right Eye Near:   Left Eye Near:    Bilateral Near:     Physical Exam Vitals and nursing note reviewed.  Constitutional:      General: He is not in acute distress.    Appearance: Normal appearance. He is not ill-appearing.  HENT:     Head:      Comments: There is a laceration to the right eyebrow that is not bleeding.  There is tenderness along the orbital bone.  There is also bruising along the lower left orbital bone with tenderness to palpation.  No obvious deformity.    Nose: Nose normal.     Right Nostril: No epistaxis.     Left Nostril: No epistaxis.  Eyes:     Extraocular Movements: Extraocular movements intact.     Conjunctiva/sclera: Conjunctivae normal.     Comments: Pinpoint pupils   Cardiovascular:     Rate and Rhythm: Normal rate.  Pulmonary:     Effort: Pulmonary effort is normal.  Skin:    General: Skin is warm and dry.  Neurological:     Mental Status: He is alert.     Comments: Patient is visibly intoxicated dozing off during questioning  Psychiatric:        Mood and Affect: Mood normal.        Behavior: Behavior normal.      UC Treatments / Results  Labs (all labs ordered are listed, but only abnormal results are displayed) Labs Reviewed - No data to display  EKG   Radiology No results found.  Procedures Procedures (including critical care time)  Medications Ordered in UC Medications - No data to display  Initial Impression / Assessment and Plan / UC Course  I have reviewed the triage vital signs and the nursing notes.  Pertinent labs & imaging results that were available during my care of the patient were reviewed by me and considered in my medical decision making (see chart for details).     Patient with head injury that is visibly intoxicated.  Given his head injury and that he has been drinking advised to go to the emergency room for further evaluation and treatment/scans.  He reluctantly agreed and declined EMS transfer.  He will go POV with his friend driving to the emergency room. Final Clinical Impressions(s) / UC Diagnoses   Final diagnoses:  Laceration of right eyebrow, initial encounter  Injury of head, initial encounter  Fall from standing, initial encounter  Alcoholic intoxication with complication [facility])     Discharge Instructions      Please go to the emergency room for further evaluation of your head injury    ED Prescriptions   None    PDMP not reviewed this encounter.   Loreda Myla SAUNDERS, NP 06/23/24 [phone]

## 2024-06-23 NOTE — Discharge Instructions (Signed)
 Please go to the emergency room for further evaluation of your head injury.

## 2024-06-23 NOTE — ED Triage Notes (Addendum)
 Pt states he was drinking heavily today and fell and hit his head on edge of wooden table. Pt denies LOC. Pt c/o left shoulder pain. Pt has lac 2.4cmx0.4cm on area above right eye. Pt has bruising on left side cheek bone. Pt has a couple of small hematomas scattered across forehead. Pt has 2+ left radial pulse, cap refill is than 3 sec, warm to touch, 4/5 left grip strength. Pt has full ROM of left shoulder. Pt appears intoxicted

## 2024-07-20 ENCOUNTER — Encounter (HOSPITAL_COMMUNITY): Payer: Self-pay

## 2024-07-20 ENCOUNTER — Emergency Department (HOSPITAL_COMMUNITY)
Admission: EM | Admit: 2024-07-20 | Discharge: 2024-07-22 | Disposition: A | Discharge status: Other Acute Inpatient Hospital | Attending: Emergency Medicine | Admitting: Emergency Medicine

## 2024-07-20 DIAGNOSIS — J45909 Unspecified asthma, uncomplicated: Secondary | ICD-10-CM | POA: Insufficient documentation

## 2024-07-20 DIAGNOSIS — F3110 Bipolar disorder, current episode manic without psychotic features, unspecified: Secondary | ICD-10-CM | POA: Diagnosis present

## 2024-07-20 DIAGNOSIS — F319 Bipolar disorder, unspecified: Secondary | ICD-10-CM | POA: Diagnosis not present

## 2024-07-20 DIAGNOSIS — F419 Anxiety disorder, unspecified: Secondary | ICD-10-CM | POA: Diagnosis not present

## 2024-07-20 DIAGNOSIS — F3010 Manic episode without psychotic symptoms, unspecified: Secondary | ICD-10-CM

## 2024-07-20 DIAGNOSIS — R451 Restlessness and agitation: Secondary | ICD-10-CM | POA: Diagnosis not present

## 2024-07-20 LAB — COMPREHENSIVE METABOLIC PANEL WITH GFR
ALT: 42 U/L (ref 0–44)
AST: 27 U/L (ref 15–41)
Albumin: 4.4 g/dL (ref 3.5–5.0)
Alkaline Phosphatase: 60 U/L (ref 38–126)
Anion gap: 14 (ref 5–15)
BUN: 14 mg/dL (ref 6–20)
CO2: 21 mmol/L — ABNORMAL LOW (ref 22–32)
Calcium: 9.6 mg/dL (ref 8.9–10.3)
Chloride: 106 mmol/L (ref 98–111)
Creatinine, Ser: 1.02 mg/dL (ref 0.61–1.24)
GFR, Estimated: >60 mL/min (ref >60)
Glucose, Bld: 104 mg/dL — ABNORMAL HIGH (ref 70–99)
Potassium: 3.2 mmol/L — ABNORMAL LOW (ref 3.5–5.1)
Sodium: 140 mmol/L (ref 135–145)
Total Bilirubin: 0.5 mg/dL (ref 0.0–1.2)
Total Protein: 7.1 g/dL (ref 6.5–8.1)

## 2024-07-20 LAB — CBC
HCT: 40.9 % (ref 39.0–52.0)
Hemoglobin: 14 g/dL (ref 13.0–17.0)
MCH: 28.5 pg (ref 26.0–34.0)
MCHC: 34.2 g/dL (ref 30.0–36.0)
MCV: 83.3 fL (ref 80.0–100.0)
Platelets: 280 K/uL (ref 150–400)
RBC: 4.91 MIL/uL (ref 4.22–5.81)
RDW: 12.1 % (ref 11.5–15.5)
WBC: 10.1 K/uL (ref 4.0–10.5)
nRBC: 0 % (ref 0.0–0.2)

## 2024-07-20 LAB — ETHANOL: Alcohol, Ethyl (B): <15 mg/dL (ref <15)

## 2024-07-20 LAB — SALICYLATE LEVEL: Salicylate Lvl: <7 mg/dL — ABNORMAL LOW (ref 7.0–30.0)

## 2024-07-20 LAB — ACETAMINOPHEN LEVEL: Acetaminophen (Tylenol), Serum: <10 ug/mL — ABNORMAL LOW (ref 10–30)

## 2024-07-20 MED ORDER — DIPHENHYDRAMINE HCL 50 MG/ML IJ SOLN
50.0000 mg | Freq: Once | INTRAMUSCULAR | Status: AC
  Administered 2024-07-20: 50 mg via INTRAMUSCULAR
  Filled 2024-07-20: qty 1

## 2024-07-20 MED ORDER — STERILE WATER FOR INJECTION IJ SOLN
INTRAMUSCULAR | Status: AC
  Administered 2024-07-20: 1.2 mL via INTRAMUSCULAR
  Filled 2024-07-20: qty 10

## 2024-07-20 MED ORDER — RISPERIDONE 0.5 MG PO TBDP
2.0000 mg | ORAL_TABLET | Freq: Three times a day (TID) | ORAL | Status: DC | PRN
  Administered 2024-07-22: 2 mg via ORAL
  Filled 2024-07-20 (×2): qty 4

## 2024-07-20 MED ORDER — ZOLPIDEM TARTRATE 5 MG PO TABS
5.0000 mg | ORAL_TABLET | Freq: Every evening | ORAL | Status: DC | PRN
  Administered 2024-07-21: 5 mg via ORAL
  Filled 2024-07-20: qty 1

## 2024-07-20 MED ORDER — STERILE WATER FOR INJECTION IJ SOLN: 5.0000 mL | INTRAMUSCULAR | Status: AC

## 2024-07-20 MED ORDER — ACETAMINOPHEN 325 MG PO TABS
650.0000 mg | ORAL_TABLET | ORAL | Status: DC | PRN
  Administered 2024-07-21: 650 mg via ORAL
  Filled 2024-07-20: qty 2

## 2024-07-20 MED ORDER — ZIPRASIDONE MESYLATE 20 MG IM SOLR
20.0000 mg | Freq: Once | INTRAMUSCULAR | Status: AC
Start: 2024-07-20 — End: 2024-07-20

## 2024-07-20 MED ORDER — LORAZEPAM 1 MG PO TABS
1.0000 mg | ORAL_TABLET | ORAL | Status: AC | PRN
  Administered 2024-07-21: 1 mg via ORAL
  Filled 2024-07-20: qty 1

## 2024-07-20 MED ORDER — ZIPRASIDONE MESYLATE 20 MG IM SOLR
20.0000 mg | Freq: Once | INTRAMUSCULAR | Status: AC
Start: 2024-07-20 — End: 2024-07-20
  Administered 2024-07-20: 20 mg via INTRAMUSCULAR
  Filled 2024-07-20: qty 20

## 2024-07-20 MED ORDER — ZIPRASIDONE MESYLATE 20 MG IM SOLR
INTRAMUSCULAR | Status: AC
  Administered 2024-07-20: 20 mg via INTRAMUSCULAR
  Filled 2024-07-20: qty 20

## 2024-07-20 MED ORDER — ZIPRASIDONE MESYLATE 20 MG IM SOLR
20.0000 mg | Freq: Once | INTRAMUSCULAR | Status: AC
  Administered 2024-07-20: 20 mg via INTRAMUSCULAR
  Filled 2024-07-20: qty 20

## 2024-07-20 MED ORDER — LORAZEPAM 2 MG/ML IJ SOLN
2.0000 mg | Freq: Once | INTRAMUSCULAR | Status: AC
  Administered 2024-07-20: 2 mg via INTRAMUSCULAR
  Filled 2024-07-20: qty 1

## 2024-07-20 MED ORDER — STERILE WATER FOR INJECTION IJ SOLN
INTRAMUSCULAR | Status: AC
  Administered 2024-07-20: 2.1 mL
  Filled 2024-07-20: qty 10

## 2024-07-20 NOTE — ED Notes (Signed)
Pt belongings placed in locker 41. 

## 2024-07-20 NOTE — ED Notes (Signed)
 Patient ran to nurses station stating I have a itch that there is something going on in [place]  right now Nurse assured patient he is safe and in [place] at this time. Patient redirected back to his room.

## 2024-07-20 NOTE — ED Notes (Signed)
 Pt placed in non-violent restraints so he does not pull off monitoring equipment.  Black restraints have to be utilized d/t Pt strength.

## 2024-07-20 NOTE — ED Triage Notes (Signed)
 Per EMS and [employer], Parents called 911 because Pt was wondering down the street and having a manic episode.  Chart Review reflects a history of bipolar affective.  Parents reported the Pt has not sleep in 3 days.  Pt noted to  be intermittently screaming and speaking gibberish.    Pt denies ETOH use, but will not answer regarding drug use.

## 2024-07-20 NOTE — ED Notes (Signed)
 EDP does not want staff to complete and In and Out.  Sts Pt can urinate when he wakes up.

## 2024-07-20 NOTE — ED Notes (Addendum)
 Patient screaming about the voices in his head. Screaming 0 equals 0. Patient said FUCK YOU. Dr. Charlyn verbal order 20mg  geodon.

## 2024-07-20 NOTE — ED Notes (Addendum)
 Pt continues to rest comfortably w/ even and unlabored respirations.  Sitter at bedside.  Non-violent restraints fully removed.  Pt placed in scrub pants and non-skid socks.

## 2024-07-20 NOTE — ED Notes (Addendum)
 Pt noted to be sleeping.  Pt taken out of leg restraints.

## 2024-07-20 NOTE — ED Provider Notes (Signed)
 [place] EMERGENCY DEPARTMENT AT [facility] Provider Note   CSN: [id number] Arrival date & time: 07/20/24  1238     Patient presents with: IVC and Manic Behavior   Timothy Figueroa is a 28 y.o. male.   HPI     28 year old male comes in with chief complaint of agitation, delusions.  Patient brought in by [facility] via EMS.  Parents called 911 because patient was wandering down the street and knocking on neighbors door.  Patient actually called 911 earlier today and claimed to the operator that his neighbors are spying on him.  Police had made a wellness check early in the morning.  Thereafter, neighbor started calling 911 indicating that patient was running up and down the street, and shouting that he had guns and rifles.  Patient extremely agitated upon arrival.  Upon review of records, it appears that patient has history of bipolar affective disorder, with mania.  Per police, patient'S parents indicated that patient was not taking his medication and might not have slept in couple of nights.  Prior to Admission medications   Medication Sig Start Date End Date Taking? Authorizing Provider  clindamycin-benzoyl peroxide (BENZACLIN) gel Apply 1 Application topically 2 (two) times daily. 03/14/24   [provider]  lamoTRIgine  (LAMICTAL ) 25 MG tablet Take 25 mg by mouth daily. 03/12/24   [provider]  OLANZapine  (ZYPREXA ) 10 MG tablet Take 10 mg by mouth at bedtime. 03/26/24   [provider]  tretinoin (RETIN-A) 0.05 % cream Apply 1 Application topically at bedtime. 03/10/24   [provider]    Allergies: Penicillins and Sulfa antibiotics    Review of Systems  All other systems reviewed and are negative.   Updated Vital Signs BP 117/73   Pulse 80   Temp (!) 97.5 F (36.4 C) (Axillary)   Resp (!) 21   Ht 6' (1.829 m)   Wt 104.3 kg   SpO2 99%   BMI 31.19 kg/m   Physical Exam Vitals and nursing note reviewed.  Constitutional:       General: He is in acute distress.     Appearance: He is well-developed.  Eyes:     Extraocular Movements: Extraocular movements intact.     Pupils: Pupils are equal, round, and reactive to light.  Cardiovascular:     Rate and Rhythm: Normal rate.  Pulmonary:     Effort: Pulmonary effort is normal.  Musculoskeletal:     Cervical back: Neck supple.  Skin:    General: Skin is warm.  Neurological:     Mental Status: He is alert and oriented to person, place, and time.  Psychiatric:        Mood and Affect: Mood is anxious. Affect is labile, angry and inappropriate.        Speech: Speech is rapid and pressured.        Behavior: Behavior is agitated and aggressive.     (all labs ordered are listed, but only abnormal results are displayed) Labs Reviewed  COMPREHENSIVE METABOLIC PANEL WITH GFR - Abnormal; Notable for the following components:      Result Value   Potassium 3.2 (*)    CO2 21 (*)    Glucose, Bld 104 (*)    All other components within normal limits  SALICYLATE LEVEL - Abnormal; Notable for the following components:   Salicylate Lvl <7.0 (*)    All other components within normal limits  ACETAMINOPHEN  LEVEL - Abnormal; Notable for the following components:  Acetaminophen  (Tylenol ), Serum <10 (*)    All other components within normal limits  URINE CULTURE  ETHANOL  CBC  URINE DRUG SCREEN    EKG: EKG Interpretation Date/Time:  Sunday July 20 2024 12:43:08 EDT Ventricular Rate:  103 PR Interval:  166 QRS Duration:  90 QT Interval:  339 QTC Calculation: 444 R Axis:   69  Text Interpretation: Sinus tachycardia No acute changes No old tracing to compare Confirmed by Charlyn Sora [phone]) on 07/20/2024 2:25:53 PM  Radiology: No results found.   .Critical Care  Performed by: Charlyn Sora, MD Authorized by: Charlyn Sora, MD   Critical care provider statement:    Critical care time (minutes):  48   Critical care was necessary to treat or prevent  imminent or life-threatening deterioration of the following conditions: Psychiatry crisis, acute mania.   Critical care was time spent personally by me on the following activities:  Development of treatment plan with patient or surrogate, discussions with consultants, evaluation of patient's response to treatment, examination of patient, ordering and review of laboratory studies, ordering and review of radiographic studies, ordering and performing treatments and interventions, pulse oximetry, re-evaluation of patient's condition and review of old charts    Medications Ordered in the ED  risperiDONE (RISPERDAL M-TABS) disintegrating tablet 2 mg (has no administration in time range)    And  LORazepam  (ATIVAN ) tablet 1 mg (has no administration in time range)  acetaminophen  (TYLENOL ) tablet 650 mg (has no administration in time range)  zolpidem (AMBIEN) tablet 5 mg (has no administration in time range)  ziprasidone (GEODON) injection 20 mg (20 mg Intramuscular Given 07/20/24 1331)  sterile water (preservative free) injection (2.1 mLs  Given 07/20/24 1331)  sterile water (preservative free) injection 5 mL (1.2 mLs Intramuscular Given 07/20/24 1349)  ziprasidone (GEODON) injection 20 mg (20 mg Intramuscular Given 07/20/24 1349)                                    Medical Decision Making Amount and/or Complexity of Data Reviewed Labs: ordered.  Risk OTC drugs. Prescription drug management.   Patient comes to the ER with cc of hallucination, agitation, aggressive behavior. Patient has been involuntarily committed by parents. Patient has pertinent past medical history of bipolar disorder. Currently patient is agitated, difficult to direct.  Security is at the bedside.  Nonviolent restraints ordered.  IM Geodon 20 mg given.  There was no significant improvement with it.  Patient required another 20 mg IM Geodon.  Thereafter patient did respond and is now sleeping comfortably.  Unable to get any  meaningful history given his mania.  I have reviewed previous encounters for this patient and reviewed their primary medications.  Differential diagnosis considered for this patient includes: Depression Bipolar disorder Schizophrenia Substance abuse Suicidal ideation Acute withdrawal  Appropriate labs have been ordered. Patient is medically cleared for psychiatric evaluation.    Final diagnoses:  Manic behavior [facility])    ED Discharge Orders     None          Charlyn Sora, MD 07/20/24 [phone]

## 2024-07-20 NOTE — ED Notes (Signed)
 EKG completed

## 2024-07-20 NOTE — ED Provider Notes (Signed)
 NP attempted to assess face to face- patient is difficult to arouse, RN reported patient had 2 doses of Geodon 20 mg IM due to agitation and disorientation at 1331pm- will follow-up when patient is able to participate in assessment. - Recruitment consultant at bedside

## 2024-07-21 DIAGNOSIS — F319 Bipolar disorder, unspecified: Secondary | ICD-10-CM | POA: Diagnosis present

## 2024-07-21 LAB — URINE DRUG SCREEN
Amphetamines: POSITIVE — AB
Barbiturates: NEGATIVE
Benzodiazepines: POSITIVE — AB
Cocaine: NEGATIVE
Fentanyl: NEGATIVE
Methadone Scn, Ur: NEGATIVE
Opiates: NEGATIVE
Tetrahydrocannabinol: POSITIVE — AB

## 2024-07-21 LAB — RESP PANEL BY RT-PCR (RSV, FLU A&B, COVID)  RVPGX2
Influenza A by PCR: NEGATIVE
Influenza B by PCR: NEGATIVE
Resp Syncytial Virus by PCR: NEGATIVE
SARS Coronavirus 2 by RT PCR: NEGATIVE

## 2024-07-21 MED ORDER — HALOPERIDOL LACTATE 5 MG/ML IJ SOLN
5.0000 mg | Freq: Four times a day (QID) | INTRAMUSCULAR | Status: DC | PRN
  Administered 2024-07-21: 5 mg via INTRAMUSCULAR
  Filled 2024-07-21: qty 1

## 2024-07-21 MED ORDER — LORAZEPAM 2 MG/ML IJ SOLN
2.0000 mg | Freq: Four times a day (QID) | INTRAMUSCULAR | Status: DC | PRN
  Administered 2024-07-21: 2 mg via INTRAMUSCULAR
  Filled 2024-07-21: qty 1

## 2024-07-21 MED ORDER — DIPHENHYDRAMINE HCL 50 MG/ML IJ SOLN
50.0000 mg | Freq: Four times a day (QID) | INTRAMUSCULAR | Status: DC | PRN
  Administered 2024-07-21: 50 mg via INTRAMUSCULAR
  Filled 2024-07-21: qty 1

## 2024-07-21 NOTE — ED Provider Notes (Signed)
 Emergency Medicine Observation Re-evaluation Note  Timothy Figueroa is a 28 y.o. male, seen on rounds today.  Pt initially presented to the ED for complaints of IVC and Manic Behavior Currently, the patient is resting.  Physical Exam  BP 107/63 (BP Location: Right Arm)   Pulse 71   Temp (!) 97.2 F (36.2 C) (Oral)   Resp 19   Ht 6' (1.829 m)   Wt 104.3 kg   SpO2 100%   BMI 31.19 kg/m  Physical Exam General: NAD   ED Course / MDM  EKG:EKG Interpretation Date/Time:  Sunday July 20 2024 12:43:08 EDT Ventricular Rate:  103 PR Interval:  166 QRS Duration:  90 QT Interval:  339 QTC Calculation: 444 R Axis:   69  Text Interpretation: Sinus tachycardia No acute changes No old tracing to compare Confirmed by Charlyn Sora [phone]) on 07/20/2024 2:25:53 PM  I have reviewed the labs performed to date as well as medications administered while in observation.  Recent changes in the last 24 hours include agitation.  Plan  Current plan is for pending TTS/Psych evaluation.    Laurice Maude BROCKS, MD 07/21/24 [phone]

## 2024-07-21 NOTE — ED Notes (Signed)
 Patient resting in bed chest rising and falling.

## 2024-07-21 NOTE — ED Notes (Signed)
 Patient belongings were placed in locker 35 and patient was wanded and escorted by security when he came to [place].

## 2024-07-21 NOTE — ED Notes (Signed)
 Patient was agitated and having escalating behaviors. Patient anxious, yelling, demanding to go home. Patient also making random statements that do not make sense.  Patient given PRNs (see MAR)

## 2024-07-21 NOTE — ED Notes (Addendum)
 Timothy Figueroa

## 2024-07-21 NOTE — Consult Note (Signed)
 [facility] Psychiatric Consult Initial  Patient Name: [name]  MRN: [id number]  DOB: [birth year]  Consult Order details:  Orders (From admission, onward)     Start     Ordered   07/20/24 1424  CONSULT TO CALL ACT TEAM       Ordering Provider: Charlyn Sora, MD  Provider:  (Not yet assigned)  Question:  Reason for Consult?  Answer:  Psych consult   07/20/24 1424             Mode of Visit: In person    Psychiatry Consult Evaluation  Service Date: July 21, 2024 LOS:  LOS: 0 days  Chief Complaint Agitation, delusions, medication non-compliance  Primary Psychiatric Diagnoses  Bipolar affective disorder with manic episodes  Assessment  Timothy Figueroa is a 28 y.o. male admitted: Presented to the EDfor 07/20/2024 12:40 PM for agitation, delusions, medication non-compliance. He carries the psychiatric diagnoses of bipolar disorder and has a past medical history of athletes foot, chronic pain.   This is a 28 year old male with a well-documented history of Bipolar I Disorder with manic episodes and psychotic features, who presents under emergent circumstances due to severe agitation, paranoid delusions, and escalating erratic behavior. His parents report progressive deterioration over several days, marked by medication noncompliance and insomnia, both of which are common precipitants for acute mania and psychosis.  On evaluation, the patient exhibited clear signs of paranoid delusions, grandiosity, and possible auditory hallucinations, demonstrated by his internal preoccupation and defensive statements. Despite these symptoms, he lacks insight into his illness and refuses medications, expressing distrust of psychiatric treatment and preference for substance use as a coping mechanism.  The patient's recent inpatient psychiatric hospitalization (June 2025) and rapid decompensation after discharge indicate poor outpatient stabilization and a high likelihood of recurrent  episodes without intensive intervention. His erratic behavior in the community, including shouting about weapons and knocking on neighbors' doors, represents a significant safety risk, both to himself and to the public.  Given his current level of agitation, impaired judgment, and severe psychotic symptoms, he meets criteria for involuntary inpatient psychiatric admission for safety, stabilization, medication management, and further diagnostic evaluation.. Please see plan below for detailed recommendations.   Diagnoses:  Active Hospital problems: Principal Problem:   Bipolar I disorder (HCC)    Plan   ## Psychiatric Medication Recommendations:  Continue patient home medications   ## Medical Decision Making Capacity: Not specifically addressed in this encounter  ## Further Work-up:  -- No further workup needed at this time EKG or UDS -- most recent EKG on 07/20/2024 had QtC of 444 -- Pertinent labwork reviewed earlier this admission includes: CBC, CMP, EKG, UDS   ## Disposition:-- We recommend inpatient psychiatric hospitalization after medical hospitalization. Patient has been involuntarily committed on 07/20/2024.   ## Behavioral / Environmental: -Difficult Patient (SELECT OPTIONS FROM BELOW), To minimize splitting of staff, assign one staff person to communicate all information from the team when feasible., or Utilize compassion and acknowledge the patient's experiences while setting clear and realistic expectations for care.    ## Safety and Observation Level:  - Based on my clinical evaluation, I estimate the patient to be at moderate risk of self harm in the current setting. - At this time, we recommend  routine. This decision is based on my review of the chart including patient's history and current presentation, interview of the patient, mental status examination, and consideration of suicide risk including evaluating suicidal ideation, plan, intent, suicidal or self-harm  behaviors, risk factors, and protective factors. This judgment is based on our ability to directly address suicide risk, implement suicide prevention strategies, and develop a safety plan while the patient is in the clinical setting. Please contact our team if there is a concern that risk level has changed.  CSSR Risk Category:C-SSRS RISK CATEGORY: No Risk  Suicide Risk Assessment: Patient has following modifiable risk factors for suicide: medication noncompliance and active mental illness (to encompass adhd, tbi, mania, psychosis, trauma reaction), which we are addressing by recommending inpatient psychiatric admission. Patient has following non-modifiable or demographic risk factors for suicide: male gender and psychiatric hospitalization Patient has the following protective factors against suicide: Supportive family and Supportive friends  Thank you for this consult request. Recommendations have been communicated to the primary team.  We will continue to follow patient at this time.   CATHALEEN ADAM, PMHNP       History of Present Illness  Relevant Aspects of Hospital ED Course:  Admitted on 07/20/2024 for Agitation, delusions, medication non-compliance.  Patient Report:  Patient has a known history of bipolar affective disorder with mania. According to police and collateral information from parents, patient has not been taking his prescribed psychiatric medications and has reportedly not slept for several nights, with progressive agitation and erratic behavior.  During evaluation, patient was lying on a mattress on the floor. When approached by the psychiatric team, he abruptly stood up, moving close to the provider and requiring verbal redirection to maintain safety and appropriate boundaries.  Patient adamantly denied needing hospitalization, stating, "Every time I say something about the government, they admit me to the hospital." He expressed paranoid delusions about the federal  government following him and provided a "government number." He also stated, "[name] wanted me to commit suicide."  Patient admitted to stopping Lamictal  and Zyprexa , citing adverse effects such as blotchy skin on his feet, which he fears may indicate Stevens-Johnson syndrome. He stated he does not believe in psychiatric medications and would rather "do it the old-school way--smoking joints and drinking beer."  Per chart review, patient was recently discharged in June 2025 from an inpatient psychiatric hospitalization for psychosis. He is followed outpatient by Olam Ricker at the [facility] in [place].  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 During evaluation, he appeared internally preoccupied, often turning his head toward the wall as if responding to internal stimuli. He denied suicidal or homicidal ideations, psychosis, and paranoia despite observed evidence to the contrary.  Psych ROS:  Depression: Denies  Anxiety:  Endorses Mania (lifetime and current): Positive Psychosis: (lifetime and current): Positive  Collateral information:  Behavioral health coordinator attempted to contact patient's father, no answer, HIPAA compliant voicemail left  Review of Systems  Psychiatric/Behavioral:  Positive for hallucinations.        Delusional and paranoid     Psychiatric and Social History  Psychiatric History:  Information collected from chart review  Prev Dx/Sx: Bipolar disorder [facility] Home Meds (current): Lamictal , and Zyprexa  Previous Med Trials: Yes Therapy: Denies  Prior Psych Hospitalization: Yes Prior Self Harm: Denies Prior Violence: Yes  Family Psych History: Denies Family Hx suicide: Denies  Social History:  Developmental Hx: Deferred Educational Hx: Patient is a PhD Consulting civil engineer at [employer] Occupational Hx: Employed Legal Hx: Yes Living Situation:  Has his own apartment Spiritual Hx: Yes Access to weapons/lethal means: Denies  Substance History Alcohol: Yes Type of  alcohol Beer Last Drink unknown Number of drinks per day varies History of alcohol withdrawal seizures denies History of DT's denies Tobacco: Yes Illicit drugs: Yes Prescription drug abuse: Denies Rehab hx: Denies  Exam Findings  Physical Exam:  Vital Signs:  Temp:  [97.2 F (36.2 C)-98.6 F (37 C)] 97.7 F (36.5 C) (09/29 0932) Pulse Rate:  [70-122] 101 (09/29 0932) Resp:  [11-24] 16 (09/29 0932) BP: (99-148)/(63-93) 111/83 (09/29 0932) SpO2:  [86 %-100 %] 100 % (09/29 0932) Weight:  [104.3 kg] 104.3 kg (09/28 1251) Blood pressure 111/83, pulse (!) 101, temperature (S) 97.7 F (36.5 C), temperature source Oral, resp. rate 16, height 6' (1.829 m), weight 104.3 kg, SpO2 100%. Body mass index is 31.19 kg/m.  Physical Exam Vitals and nursing note reviewed. Exam conducted with a chaperone present.  Neurological:     Mental Status: He is alert.  Psychiatric:        Attention and Perception: He is inattentive.        Mood and Affect: Mood is anxious. Affect is inappropriate.        Speech: Speech is rapid and pressured and tangential.        Behavior: Behavior is agitated and withdrawn.        Thought Content: Thought content is paranoid and delusional.        Cognition and Memory: Cognition is impaired.        Judgment: Judgment is impulsive and inappropriate.     Mental Status Exam: General Appearance: oung adult male, disheveled, lying on mattress on floor  Orientation:  Full (Time, Place, and Person)  Memory:  Immediate;   Poor  Concentration:  Concentration: Poor  Recall:  Fair  Attention  Poor  Eye Contact:  Minimal  Speech:  Pressured, rapid at times  Language:  Poor  Volume:  Increased  Mood: Irritable, elevated  Affect:  Labile, congruent with mood  Thought Process:  Tangential, flight of ideas  Thought Content:  Paranoid delusions  (government following him, [name] wanting him to commit suicide), grandiose themes  Suicidal Thoughts:  No  Homicidal Thoughts:  No  Judgement:  Poor  Insight:  Lacking  Psychomotor Activity:  Increased  Akathisia:  No  Fund of Knowledge:  Poor      Assets:  Communication Skills Desire for Improvement Financial Resources/Insurance Housing Social Support  Cognition:  Impaired,  Moderate  ADL's:  Impaired  AIMS (if indicated):        Other History   These have been pulled in through the EMR, reviewed, and updated if appropriate.  Family History:  The patient's family history includes Cancer in his paternal grandfather; Healthy in his mother; Hypertension in his father.  Medical History: Past Medical History:  Diagnosis Date  . Allergy   . Asthma     Surgical History: History reviewed. No pertinent surgical history.   Medications:   Current Facility-Administered Medications:  .  acetaminophen  (TYLENOL ) tablet 650 mg, 650 mg, Oral, Q4H PRN, Nanavati, Ankit, MD .  diphenhydrAMINE  (BENADRYL ) injection 50 mg, 50 mg, Intramuscular, Q6H PRN, Motley-Mangrum, Melitta Tigue A, PMHNP, 50 mg at 07/21/24 1055 .  haloperidol  lactate (HALDOL ) injection 5 mg, 5 mg, Intramuscular, Q6H PRN, Motley-Mangrum, Laurrie Toppin A, PMHNP, 5 mg at 07/21/24 1048 .  LORazepam  (ATIVAN ) injection 2 mg, 2 mg, Intramuscular, Q6H PRN, Motley-Mangrum, Emberly Tomasso A, PMHNP, 2 mg at 07/21/24 1050 .  risperiDONE (RISPERDAL M-TABS) disintegrating tablet 2 mg, 2 mg, Oral, Q8H PRN **AND** LORazepam  (ATIVAN ) tablet 1  mg, 1 mg, Oral, PRN, Nanavati, Ankit, MD .  zolpidem (AMBIEN) tablet 5 mg, 5 mg, Oral, QHS PRN, Nanavati, Ankit, MD  Current Outpatient Medications:  .  lamoTRIgine  (LAMICTAL ) 25 MG tablet, Take 25 mg by mouth daily. (Patient not taking: Reported on 07/21/2024), Disp: , Rfl:  .  OLANZapine  (ZYPREXA ) 10 MG tablet, Take 10 mg by mouth at bedtime. (Patient not taking: Reported on 07/21/2024), Disp: , Rfl:    Allergies: Allergies  Allergen Reactions  . Penicillins Hives  . Sulfa Antibiotics Hives    Kasper Mudrick MOTLEY-MANGRUM, PMHNP

## 2024-07-21 NOTE — ED Notes (Signed)
 [facility] called pts father for collateral information. [facility] left a HIPAA compliant message to return the call.  Chesley Holt, [facility]  07/21/24

## 2024-07-21 NOTE — Progress Notes (Signed)
 Pt has been accepted to [facility] on 07/22/2024 Bed assignment: 2 WEST   Pt meets inpatient criteria per: Jadeka Mangrum NP  Attending Physician will be: Dr. Ilsa   Report can be called un:lwpu:[place] unit: [phone]  Pt can arrive after on 07/22/2024  Care Team Notified: Cathaleen Jacobson NP, Landry Bull RN  [place] Sulayman Manning LCSW-A   07/21/2024 1:26 PM

## 2024-07-21 NOTE — Progress Notes (Signed)
 07/21/2024  1639  Called Sheriff [phone] to place patient on the list for transport tomorrow 9/30. Waiting for Sheriff to call back.

## 2024-07-21 NOTE — ED Notes (Signed)
 Patient refused to tale medication.  States he does not want it. Patient irritable and wants to leave.

## 2024-07-21 NOTE — ED Notes (Signed)
 Patient resting in bed in room at this time

## 2024-07-21 NOTE — ED Notes (Signed)
 Patient in room and has thrown the mattress on the floor and jumping on it at this time with loud outburst. Patient has come out the room and is now pacing in the hallway at this time. Provider contacted for medication.

## 2024-07-21 NOTE — ED Notes (Signed)
 Patient resting comfortably , breaths equal and unlabored.

## 2024-07-21 NOTE — Progress Notes (Signed)
 LCSW Progress Note  [id number]   Timothy Figueroa  07/21/2024  1:05 PM  Description:   Inpatient Psychiatric Referral  Patient was recommended inpatient per Jadeka Mangrum  (NP). There are no available beds at [facility], per [facility]). Patient was referred to the following out of network facilities:   Destination  Service Provider Address Phone Fax  [facility]  [address] [place], [place] [address] [phone]  [facility]  [address] [place] [address] [phone]  [facility]  [address] [place] [address] [phone]  [facility] EFAX  [address], [place] [phone]      Situation ongoing, CSW to continue following and update chart as more information becomes available.      [place] Encarnacion Bole, MSW, LCSW  07/21/2024 1:05 PM

## 2024-07-21 NOTE — ED Notes (Signed)
 PT belongings placed in locker 35

## 2024-07-22 NOTE — ED Provider Notes (Signed)
 Emergency Medicine Observation Re-evaluation Note  Timothy Figueroa is a 28 y.o. male, seen on rounds today.  Pt initially presented to the ED for complaints of IVC and Manic Behavior Currently, the patient is resting, comfortably.  Physical Exam  BP 101/71 (BP Location: Left Arm)   Pulse 73   Temp 98 F (36.7 C) (Oral)   Resp 16   Ht 6' (1.829 m)   Wt 104.3 kg   SpO2 100%   BMI 31.19 kg/m  Physical Exam General: Awake. Alert. No acute distress Cardiac: Regular rate rhythm Lungs: Clear to auscultation bilaterally Psych: Calm and cooperative  ED Course / MDM  EKG:EKG Interpretation Date/Time:  Sunday July 20 2024 12:43:08 EDT Ventricular Rate:  103 PR Interval:  166 QRS Duration:  90 QT Interval:  339 QTC Calculation: 444 R Axis:   69  Text Interpretation: Sinus tachycardia No acute changes No old tracing to compare Confirmed by Charlyn Sora [phone]) on 07/20/2024 2:25:53 PM  I have reviewed the labs performed to date as well as medications administered while in observation.  Recent changes in the last 24 hours include no acute events.  Plan  Current plan is for transport to [facility] this morning.  [facility] is accepting physician.    Pamella Ozell LABOR, DO 07/22/24 [phone]

## 2024-07-22 NOTE — Discharge Instructions (Signed)
 You will be transported directly to [facility] for further care

## 2024-07-23 LAB — URINE CULTURE: Culture: <10000 — AB

## 2024-09-03 ENCOUNTER — Ambulatory Visit (HOSPITAL_COMMUNITY)
Admission: EM | Admit: 2024-09-03 | Discharge: 2024-09-04 | Disposition: A | Discharge status: Other Acute Inpatient Hospital | Attending: Nurse Practitioner | Admitting: Nurse Practitioner

## 2024-09-03 DIAGNOSIS — Z79899 Other long term (current) drug therapy: Secondary | ICD-10-CM | POA: Diagnosis not present

## 2024-09-03 DIAGNOSIS — F1490 Cocaine use, unspecified, uncomplicated: Secondary | ICD-10-CM | POA: Diagnosis not present

## 2024-09-03 DIAGNOSIS — F312 Bipolar disorder, current episode manic severe with psychotic features: Secondary | ICD-10-CM | POA: Insufficient documentation

## 2024-09-03 DIAGNOSIS — Z556 Problems related to health literacy: Secondary | ICD-10-CM | POA: Insufficient documentation

## 2024-09-03 DIAGNOSIS — F29 Unspecified psychosis not due to a substance or known physiological condition: Secondary | ICD-10-CM | POA: Diagnosis present

## 2024-09-03 DIAGNOSIS — F1590 Other stimulant use, unspecified, uncomplicated: Secondary | ICD-10-CM | POA: Insufficient documentation

## 2024-09-03 DIAGNOSIS — F1210 Cannabis abuse, uncomplicated: Secondary | ICD-10-CM | POA: Insufficient documentation

## 2024-09-03 LAB — POCT URINE DRUG SCREEN - MANUAL ENTRY (I-SCREEN)
POC Amphetamine UR: POSITIVE — AB
POC Buprenorphine (BUP): NOT DETECTED
POC Cocaine UR: POSITIVE — AB
POC Marijuana UR: POSITIVE — AB
POC Methamphetamine UR: POSITIVE — AB
POC Morphine: NOT DETECTED
POC Oxazepam (BZO): NOT DETECTED
POC Oxycodone UR: NOT DETECTED
POC Secobarbital (BAR): NOT DETECTED

## 2024-09-03 LAB — URINALYSIS, ROUTINE W REFLEX MICROSCOPIC
Bacteria, UA: NONE SEEN
Bilirubin Urine: NEGATIVE
Glucose, UA: NEGATIVE mg/dL
Hgb urine dipstick: NEGATIVE
Ketones, ur: NEGATIVE mg/dL
Leukocytes,Ua: NEGATIVE
Nitrite: NEGATIVE
Protein, ur: NEGATIVE mg/dL
Specific Gravity, Urine: 1.011 (ref 1.005–1.030)
pH: 6 (ref 5.0–8.0)

## 2024-09-03 MED ORDER — ALUM & MAG HYDROXIDE-SIMETH 200-200-20 MG/5ML PO SUSP: 30.0000 mL | ORAL | Status: DC | PRN

## 2024-09-03 MED ORDER — DIPHENHYDRAMINE HCL 50 MG/ML IJ SOLN: 50.0000 mg | Freq: Three times a day (TID) | INTRAMUSCULAR | Status: DC | PRN

## 2024-09-03 MED ORDER — HALOPERIDOL LACTATE 5 MG/ML IJ SOLN: 5.0000 mg | Freq: Three times a day (TID) | INTRAMUSCULAR | Status: DC | PRN

## 2024-09-03 MED ORDER — MAGNESIUM HYDROXIDE 400 MG/5ML PO SUSP: 30.0000 mL | Freq: Every day | ORAL | Status: DC | PRN

## 2024-09-03 MED ORDER — LORAZEPAM 2 MG/ML IJ SOLN: 2.0000 mg | Freq: Three times a day (TID) | INTRAMUSCULAR | Status: DC | PRN

## 2024-09-03 MED ORDER — LORAZEPAM 1 MG PO TABS
2.0000 mg | ORAL_TABLET | Freq: Once | ORAL | Status: AC
  Administered 2024-09-03: 2 mg via ORAL
  Filled 2024-09-03: qty 2

## 2024-09-03 MED ORDER — DIPHENHYDRAMINE HCL 50 MG PO CAPS
50.0000 mg | ORAL_CAPSULE | Freq: Three times a day (TID) | ORAL | Status: DC | PRN
  Administered 2024-09-03: 50 mg via ORAL
  Filled 2024-09-03: qty 1

## 2024-09-03 MED ORDER — ACETAMINOPHEN 325 MG PO TABS: 650.0000 mg | ORAL_TABLET | Freq: Four times a day (QID) | ORAL | Status: DC | PRN

## 2024-09-03 MED ORDER — HALOPERIDOL LACTATE 5 MG/ML IJ SOLN: 10.0000 mg | Freq: Three times a day (TID) | INTRAMUSCULAR | Status: DC | PRN

## 2024-09-03 MED ORDER — HALOPERIDOL 5 MG PO TABS
5.0000 mg | ORAL_TABLET | Freq: Three times a day (TID) | ORAL | Status: DC | PRN
  Administered 2024-09-03: 5 mg via ORAL
  Filled 2024-09-03 (×2): qty 1

## 2024-09-03 NOTE — Progress Notes (Signed)
 Pt has been accepted to [facility] on 09/03/24 Bed assignment: 507-01  Pt meets inpatient criteria per: Donia Snell NP  Attending Physician will be: Leita Arts MD  Report can be called un:lwpu: Adult unit: [phone]  Pt can arrive after pending labs, vol, UDS, EKG.   Care Team Notified: [facility] AC \\Danika  Carlo PEAK, Donia Snell NP  [place] Toussaint Golson LCSW-A   09/03/2024 1:35 PM

## 2024-09-03 NOTE — Progress Notes (Signed)
   09/03/24 1106  [facility]UC Triage Screening (Walk-ins at [facility] only)  How Did You Hear About Us ? Family/Friend  What Is the Reason for Your Visit/Call Today? Menning is a 28 year old male presenting [facility] by [employer]. Pt appears to be manic upon triage and very tangential. Pt states he is taking hydroxine and not abusing substances at this time. Per BHRT, pt is using meth, alcohol and cocaine at this time. PT has a Bipolar Disorder diagnosis at this time. Pt denies substance use, Si, Hi and AVH at this time. Pt also appears to be paranoid and states, I refuse to do a drug test and to be here.  How Long Has This Been Causing You Problems? <Week  Have You Recently Had Any Thoughts About Hurting Yourself? No  Are You Planning to Commit Suicide/Harm Yourself At This time? No  Have you Recently Had Thoughts About Hurting Someone Sherral? No  Are You Planning To Harm Someone At This Time? No  Exploitation of patient/patient's resources Denies  Self-Neglect Denies  Possible abuse reported to: Other (Comment)  Are you currently experiencing any auditory, visual or other hallucinations? No  Have You Used Any Alcohol or Drugs in the Past 24 Hours? No  Do you have any current medical co-morbidities that require immediate attention? No  What Do You Feel Would Help You the Most Today? Alcohol or Drug Use Treatment;Medication(s)  If access to [facility] was not available, would you have sought care in the Emergency Department? No  Determination of Need Urgent (48 hours)  Options For Referral Inpatient Hospitalization;Intensive Outpatient Therapy  Determination of Need filed? Yes

## 2024-09-03 NOTE — BH Assessment (Addendum)
 Comprehensive Clinical Assessment (CCA) Note  09/03/2024 Timothy Figueroa [id number]   Disposition: Per Donia Snell, NP inpatient treatment is recommended.  Disposition SW to pursue appropriate inpatient options.  The patient demonstrates the following risk factors for suicide: Chronic risk factors for suicide include: psychiatric disorder of Bipolar untreated, substance use disorder, and demographic factors (male, >29 y/o). Acute risk factors for suicide include: family or marital conflict and ongoing SA issues. Protective factors for this patient include: positive social support, hope for the future, and life satisfaction. Considering these factors, the overall suicide risk at this point appears to be low. Patient is appropriate for outpatient follow up, once stabilized.   Patient is a 28 year old male with a history of Bipolar Disorder untreated and polysubstance abuse who presents via [employer]/BHRT, under [facility]C [facility]. Per IVC, initiated by mother, Respondent has been diagnosed with Bipolar and substance use disorder. He was recently committed in June 2025 to [facility]. Respondent isn't eating, sleeping or attending to personal hygiene. Respondent is hallucinating. He believes that his parents are pedophiles and they are a part of the [employer]. He also believes that ICE and Police Department are surveilling him. He is using meth cocaine and alcohol.  Upon assessment patient is observed sleeping heavily with his head on the table, and was difficult to arouse.  BHRT noted patient has a hx of substance use to include, meth, cocaine and ETOH use.  Patient denies any recent substance use, however then states he "can't recall" when he last used meth.  He appears impaired as evidenced by sudden mood instability on waking, garbled speech and later pressured, loud and disorganized ranting for over an hour, during and following assessment.  He insists his parents  have "IVC'd me for the fourth time this year to cover their crimes of extortion and child sex trafficking."  He then demands that we call an attorney for him, stating he has no mental health condition and he does not need help.  He acknowledges "they have diagnosed me with Bipolar" however he does not agree with this diagnosis.  He has been admitted to [facility] several times this year, under IVC initiated by mother with similar concerns.  He has been referred to outpatient treatment, however he typically discontinues medications upon discharge from inpatient treatment and he does not follow up as recommended.  Patient states he is in the [employer] graduate program.  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  Patient is quite disorganized, at one point stating he just awoke to thinking about Kermit the frog quoting Kermit's song It's not easy being green.  Clinician and provider were unable to obtain further information, given patient's altered mental state.  While awaiting admission to observation, patient is heard ranting incessantly in varying accents about his rights, the government shut down, conspiracies, etc.     Chief Complaint:  Chief Complaint  Patient presents with   Manic Behavior   IVC   Visit Diagnosis: Bipolar Disorder, untreated                             Polysubstance Abuse (pt denies recent use)    CCA Screening, Triage and Referral (STR)  Patient Reported Information How did you hear about us ? Family/Friend  What Is the Reason for Your Visit/Call Today? Ahrendt is a  28 year old male presenting to [facility] escorted by [employer]. Pt appears to be manic upon triage and very tangential. Pt states he is taking hydroxine and not abusing substances at this time. Per BHRT, pt is using meth, alcohol and cocaine at this time. PT has a Bipolar Disorder diagnosis at this time. Pt denies substance use, Si, Hi and AVH at  this time. Pt also appears to be paranoid and states, I refuse to do a drug test and to be here.  How Long Has This Been Causing You Problems? <Week  What Do You Feel Would Help You the Most Today? Alcohol or Drug Use Treatment; Medication(s)   Have You Recently Had Any Thoughts About Hurting Yourself? No  Are You Planning to Commit Suicide/Harm Yourself At This time? No   Flowsheet Row ED from 09/03/2024 in [facility] ED from 07/20/2024 in [facility] Emergency Department at [facility] from 06/23/2024 in [facility])  C-SSRS RISK CATEGORY No Risk No Risk No Risk    Have you Recently Had Thoughts About Hurting Someone Sherral? No  Are You Planning to Harm Someone at This Time? No  Explanation: N/A   Have You Used Any Alcohol or Drugs in the Past 24 Hours? No  How Long Ago Did You Use Drugs or Alcohol? Unknown - appears impaired, denying use What Did You Use and How Much? A little but of weed   Do You Currently Have a Therapist/Psychiatrist? No  Name of Therapist/Psychiatrist:    Have You Been Recently Discharged From Any Office Practice or Programs? No  Explanation of Discharge From Practice/Program: N/A    CCA Screening Triage Referral Assessment Type of Contact: Face-to-Face  Telemedicine Service Delivery:   Is this Initial or Reassessment?   Date Telepsych consult ordered in [facility]:    Time Telepsych consult ordered in [facility]:    Location of Assessment: [facility] Assessment Services  Provider Location: [facility] Assessment Services   Collateral Involvement: IVC, provider to contact patient's mother/petitioner.   Does Patient Have a Automotive Engineer Guardian? No  Legal Guardian Contact Information: N/A  Copy of Legal Guardianship Form: -- (N/A)  Legal Guardian Notified of Arrival: -- (N/A)  Legal Guardian Notified of Pending Discharge: -- (N/A)  If Minor and Not Living with  Parent(s), Who has Custody? N/A  Is CPS involved or ever been involved? Never  Is APS involved or ever been involved? Never   Patient Determined To Be At Risk for Harm To Self or Others Based on Review of Patient Reported Information or Presenting Complaint? -- (N/A, no HI)  Method: -- (N/A, no HI)  Availability of Means: -- (N/A, no HI)  Intent: -- (N/A, no HI)  Notification Required: -- (N/A, no HI)  Additional Information for Danger to Others Potential: -- (N/A, no HI)  Additional Comments for Danger to Others Potential: N/A, no HI  Are There Guns or Other Weapons in Your Home? No  Types of Guns/Weapons: n/a  Are These Weapons Safely Secured?                            -- (n/a)  Who Could Verify You Are Able To Have These Secured: n/a  Do You Have any Outstanding Charges, Pending Court Dates, Parole/Probation? none reported  Contacted To Inform of Risk of Harm To Self or Others: Patent Examiner; Family/Significant Other:  Does Patient Present under Involuntary Commitment? Yes    [place] of Residence: [place]   Patient Currently Receiving the Following Services: Not Receiving Services   Determination of Need: Urgent (48 hours)   Options For Referral: Inpatient Hospitalization; Intensive Outpatient Therapy; Outpatient Therapy; Medication Management     CCA Biopsychosocial Patient Reported Schizophrenia/Schizoaffective Diagnosis in Past: No   Strengths: Phd student in Chemistry, per pt report.  His parents are supportive.   Mental Health Symptoms Depression:  Difficulty Concentrating; Irritability (uta)   Duration of Depressive symptoms: Duration of Depressive Symptoms: Greater than two weeks   Mania:  Racing thoughts; Overconfidence; Increased Energy; Change in energy/activity; Irritability   Anxiety:   Worrying; Tension; Sleep; Restlessness   Psychosis:  Delusions   Duration of Psychotic symptoms: Duration of Psychotic Symptoms: Less than six  months   Trauma:  None   Obsessions:  None   Compulsions:  None   Inattention:  N/A   Hyperactivity/Impulsivity:  N/A   Oppositional/Defiant Behaviors:  N/A   Emotional Irregularity:  Mood lability   Other Mood/Personality Symptoms:  NA    Mental Status Exam Appearance and self-care  Stature:  Average   Weight:  Average weight   Clothing:  Casual   Grooming:  Normal   Cosmetic use:  None   Posture/gait:  Normal   Motor activity:  Restless   Sensorium  Attention:  Confused; Distractible; Vigilant   Concentration:  Preoccupied; Scattered; Variable; Anxiety interferes; Focuses on irrelevancies   Orientation:  Person; Place ginette)   Recall/memory:  Defective in Immediate; Defective in Short-term (uta)   Affect and Mood  Affect:  Anxious; Labile   Mood:  Hypomania; Irritable   Relating  Eye contact:  Normal   Facial expression:  Tense   Attitude toward examiner:  Cooperative   Thought and Language  Speech flow: Flight of Ideas   Thought content:  Delusions   Preoccupation:  Ruminations   Hallucinations:  Other (Comment)   Organization:  Disorganized; Loose; Insurance Underwriter of Knowledge:  Average   Intelligence:  Average   Abstraction:  Functional   Judgement:  Impaired   Reality Testing:  Distorted   Insight:  Flashes of insight   Decision Making:  Confused; Impulsive; Vacilates   Social Functioning  Social Maturity:  Impulsive   Social Judgement:  Heedless   Stress  Stressors:  School; Illness (untreated Bipolar and SA issues)   Coping Ability:  Overwhelmed; Exhausted   Skill Deficits:  Self-control; Decision making; Communication; Interpersonal   Supports:  Family     Religion: Religion/Spirituality Are You A Religious Person?: No (uta) How Might This Affect Treatment?: n/a  Leisure/Recreation: Leisure / Recreation Do You Have Hobbies?: Yes (n/a) Leisure and Hobbies: being  outside  Exercise/Diet: Exercise/Diet Do You Exercise?: No Have You Gained or Lost A Significant Amount of Weight in the Past Six Months?: No Do You Follow a Special Diet?: No Do You Have Any Trouble Sleeping?: Yes Explanation of Sleeping Difficulties: Patient states he hasn't slept in a couple of days.   CCA Employment/Education Employment/Work Situation: Employment / Work Situation Employment Situation: Surveyor, Minerals Job has Been Impacted by Current Illness: Yes Describe how Patient's Job has Been Impacted: UTA due to current altered mental status Has Patient ever Been in the [employer]?: No  Education: Education Is Patient Currently Attending School?: Yes School Currently Attending: [employer] Last Grade Completed: 12 Did You Attend College?: Yes What Type of College Degree  Do you Have?: [employer], [place], 6th year student in Chemistry Did You Have An Individualized Education Program (IIEP): No Did You Have Any Difficulty At School?: No Patient's Education Has Been Impacted by Current Illness: No   CCA Family/Childhood History Family and Relationship History: Family history Marital status: Single Does patient have children?: Yes How many children?: 1 How is patient's relationship with their children?: No concerns noted, does not mention his child today, likely d/t altered mental status.  Childhood History:  Childhood History By whom was/is the patient raised?: Mother, Father Did patient suffer any verbal/emotional/physical/sexual abuse as a child?: No Did patient suffer from severe childhood neglect?: No Has patient ever been sexually abused/assaulted/raped as an adolescent or adult?: No Was the patient ever a victim of a crime or a disaster?: No Witnessed domestic violence?: No Has patient been affected by domestic violence as an adult?: No       CCA Substance Use Alcohol/Drug Use: Alcohol / Drug Use Pain Medications: see  MAR Prescriptions: see MAR Over the Counter: see MAR History of alcohol / drug use?: Yes Longest period of sobriety (when/how long): uta Negative Consequences of Use: Personal relationships, Work / Programmer, Multimedia Withdrawal Symptoms: Agitation, Irritability Substance #1 Name of Substance 1: Methamphetamines - per BHRT report 1 - Age of First Use: UTA, due to altered mental status and refusal to engage in assessment 1 - Amount (size/oz): UTA, due to altered mental status and refusal to engage in assessment 1 - Frequency: UTA, due to altered mental status and refusal to engage in assessment 1 - Duration: UTA, due to altered mental status and refusal to engage in assessment 1 - Last Use / Amount: UTA, due to altered mental status and refusal to engage in assessment 1 - Method of Aquiring: UTA, due to altered mental status and refusal to engage in assessment 1- Route of Use: UTA, due to altered mental status and refusal to engage in assessment Substance #2 Name of Substance 2: Cocaine - per BHRT report 2 - Age of First Use: UTA, due to altered mental status and refusal to engage in assessment 2 - Amount (size/oz): UTA, due to altered mental status and refusal to engage in assessment 2 - Frequency: UTA, due to altered mental status and refusal to engage in assessment 2 - Duration: UTA, due to altered mental status and refusal to engage in assessment 2 - Last Use / Amount: UTA, due to altered mental status and refusal to engage in assessment 2 - Method of Aquiring: UTA, due to altered mental status and refusal to engage in assessment 2 - Route of Substance Use: UTA, due to altered mental status and refusal to engage in assessment                     ASAM's:  Six Dimensions of Multidimensional Assessment  Dimension 1:  Acute Intoxication and/or Withdrawal Potential:   Dimension 1:  Description of individual's past and current experiences of substance use and withdrawal: Patient appears  impaired, however he denies substance use.  Per chart review, pt has admitted to Delta 8 use during last visit.  Dimension 2:  Biomedical Conditions and Complications:   Dimension 2:  Description of patient's biomedical conditions and  complications: None reported  Dimension 3:  Emotional, Behavioral, or Cognitive Conditions and Complications:  Dimension 3:  Description of emotional, behavioral, or cognitive conditions and complications: Bizarre behaviors and paranoia  Dimension 4:  Readiness to Change:  Dimension 4:  Description of Readiness to Change criteria: Patient placed under IVC, initiated by mother  Dimension 5:  Relapse, Continued use, or Continued Problem Potential:  Dimension 5:  Relapse, continued use, or continued problem potential critiera description: Continued usage  Dimension 6:  Recovery/Living Environment:  Dimension 6:  Recovery/Iiving environment criteria description: Lives with parents  ASAM Severity Score: ASAM's Severity Rating Score: 8  ASAM Recommended Level of Treatment: ASAM Recommended Level of Treatment: Level III Residential Treatment   Substance use Disorder (SUD) Substance Use Disorder (SUD)  Checklist Symptoms of Substance Use:  (UTA, denies recent use)  Recommendations for Services/Supports/Treatments: Recommendations for Services/Supports/Treatments Recommendations For Services/Supports/Treatments: Individual Therapy, Inpatient Hospitalization, Medication Management  Disposition Recommendation per psychiatric provider: We recommend inpatient psychiatric hospitalization when medically cleared. Patient is under voluntary admission status at this time; please IVC if attempts to leave hospital.   DSM5 Diagnoses: Patient Active Problem List   Diagnosis Date Noted   Bipolar I disorder (HCC) 07/21/2024     Referrals to Alternative Service(s): Referred to Alternative Service(s):   Place:   Date:   Time:    Referred to Alternative Service(s):   Place:   Date:    Time:    Referred to Alternative Service(s):   Place:   Date:   Time:    Referred to Alternative Service(s):   Place:   Date:   Time:     Deland LITTIE Louder, [facility]

## 2024-09-03 NOTE — ED Notes (Signed)
 Pt resting in bed, no acute distress noted. Will continue to monitor for safety.

## 2024-09-03 NOTE — ED Provider Notes (Addendum)
 [facility] Continuous Assessment Admission H&P  Date: 09/03/24 Patient Name: Timothy Figueroa MRN: [id number] Chief Complaint: psychosis  Diagnoses:  Final diagnoses:  Bipolar affective disorder, currently manic, severe, with psychotic features [facility])   HPI: Timothy Figueroa is a 28 y.o. male with a history of bipolar disorder and polysubstance abuse presenting to the [facility] accompanied by law enforcement for an evaluation after he was petitioned for involuntary commitment by his mother Irvin [phone]).  As per IVC petition: Respondent has been diagnosed with bipolar and substance use disorder. He was recently committed in June 2025 to [facility]. Respondent isn't eating, sleeping, or tending to personal hygiene. Respondent is hallucinating.  He believes that his parents are pedophiles and they are a part of Epstein files.  He also believes that ice and police department are surveillance him. He is using meth, cocaine and alcohol.  Assessment & review of Psychiatry Symptoms: On assessment patient presents with manic type symptoms; speech is pressured, he is hyperverbal, presents with flight of ideas, disorganized thoughts, delusions of persecution, he is paranoid, ideas are irrational, and difficult to follow at times; patient is tangential at other times, responses to questions in a nonlinear manner, alternates between speaking loudly, and crying, and sometimes speech is nonsensical.  Patient states that he was having an episode at home, raised his voice, at his parents who in turn called the police, because he wanted to move out of the house.  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  He states that his parents are out to get him, and states I feel like my parents have been using the law against me to cover up your own  evidence.  They are linked to child sex trafficking.  He goes on a tangent on how his parents are doing this.  In response to if he is abusing any substances of abuse, he states he uses only THC, prior to stating thinking about Kermit the frog, it's not easy being green.   Patient does state that he has been diagnosed with bipolar disorder, has been prescribed Vraylar, but has not been taking it.  Shares that he has not been sleeping, because he does not need sleep.  He denies SI, denies HI, denies AVH.  Reports a history of mental health hospitalizations, which are multiple in the past, with the last one being at the old [facility], states that it was not helpful. States that this was a month ago, gradually escalates in agitation rendering for further assessment to be difficult as he persists on wanting to be discharged from the behavioral health center.   Recommendations: In his current state, patient presents with a danger to himself, and others.  He is currently not rationalizing, has a distorted perception of reality, thinks that his parents are conniving against him, & are linked to child sex trafficking. Patient is requiring an inpatient hospitalization at this time to treat and stabilize current mental status. Upholding pt's IVC status so as to enhance treatment and stabilization.  Total Time spent with patient: 1.5 hours  Musculoskeletal  Strength & Muscle Tone: within normal limits Gait & Station: normal Patient leans: N/A  Psychiatric Specialty Exam  Presentation General Appearance:  Disheveled  Eye Contact: Fair  Speech: Clear and Coherent  Speech Volume: Increased  Handedness: Right   Mood and Affect  Mood:  Depressed  Affect: Non-Congruent   Thought Process  Thought Processes: Disorganized  Descriptions of Associations:Circumstantial  Orientation:Full (Time, Place and Person)  Thought Content:Tangential; Illogical  Diagnosis of Schizophrenia  or Schizoaffective disorder in past: No  Duration of Psychotic Symptoms: Less than six months  Hallucinations:Hallucinations: None  Ideas of Reference:Percusatory; Paranoia; Delusions  Suicidal Thoughts:Suicidal Thoughts: No  Homicidal Thoughts:Homicidal Thoughts: No   Sensorium  Memory: Recent Poor  Judgment: Poor  Insight: Poor   Executive Functions  Concentration: Poor  Attention Span: Poor  Recall: Poor  Fund of Knowledge: Poor  Language: Poor   Psychomotor Activity  Psychomotor Activity:Psychomotor Activity: Normal   Assets  Assets: Resilience   Sleep  Sleep:Sleep: Poor   No data recorded  Physical Exam Review of Systems  Psychiatric/Behavioral:  Positive for depression, hallucinations and substance abuse. Negative for memory loss and suicidal ideas. The patient is nervous/anxious and has insomnia.   All other systems reviewed and are negative.   Blood pressure 134/89, pulse 88, temperature 98 F (36.7 C), temperature source Oral, resp. rate 20, SpO2 99%. There is no height or weight on file to calculate BMI.  Past Psychiatric History: Bipolar d/o   Is the patient at risk to self? Yes  Has the patient been a risk to self in the past 6 months? Yes .    Has the patient been a risk to self within the distant past? Yes   Is the patient a risk to others? Yes   Has the patient been a risk to others in the past 6 months? Yes   Has the patient been a risk to others within the distant past? 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  Last Labs:  Admission on 09/03/2024  Component Date Value Ref Range Status   POC Amphetamine UR 09/03/2024 Positive (A)  NONE DETECTED (Cut Off Level 1000 ng/mL) Final   POC Secobarbital (BAR) 09/03/2024 None Detected  NONE DETECTED (Cut Off Level 300 ng/mL) Final   POC Buprenorphine (BUP) 09/03/2024  None Detected  NONE DETECTED (Cut Off Level 10 ng/mL) Final   POC Oxazepam (BZO) 09/03/2024 None Detected  NONE DETECTED (Cut Off Level 300 ng/mL) Final   POC Cocaine UR 09/03/2024 Positive (A)  NONE DETECTED (Cut Off Level 300 ng/mL) Final   POC Methamphetamine UR 09/03/2024 Positive (A)  NONE DETECTED (Cut Off Level 1000 ng/mL) Final   POC Morphine 09/03/2024 None Detected  NONE DETECTED (Cut Off Level 300 ng/mL) Final   POC Oxycodone UR 09/03/2024 None Detected  NONE DETECTED (Cut Off Level 100 ng/mL) Final   POC Marijuana UR 09/03/2024 Positive (A)  NONE DETECTED (Cut Off Level 50 ng/mL) Final  Admission on 07/20/2024, Discharged on 07/22/2024  Component Date Value Ref Range Status   Sodium 07/20/2024 140  135 - 145 mmol/L Final   Potassium 07/20/2024 3.2 (L)  3.5 - 5.1 mmol/L Final   Chloride 07/20/2024 106  98 - 111 mmol/L Final   CO2 07/20/2024 21 (L)  22 - 32 mmol/L Final   Glucose, Bld 07/20/2024 104 (H)  70 - 99 mg/dL Final   Glucose reference range applies only to samples taken after fasting for at least 8 hours.   BUN 07/20/2024 14  6 - 20 mg/dL Final   Creatinine, Ser 07/20/2024 1.02  0.61 - 1.24 mg/dL Final   Calcium 90/71/7974 9.6  8.9 - 10.3  mg/dL Final   Total Protein 90/71/7974 7.1  6.5 - 8.1 g/dL Final   Albumin 90/71/7974 4.4  3.5 - 5.0 g/dL Final   AST 90/71/7974 27  15 - 41 U/L Final   ALT 07/20/2024 42  0 - 44 U/L Final   Alkaline Phosphatase 07/20/2024 60  38 - 126 U/L Final   Total Bilirubin 07/20/2024 0.5  0.0 - 1.2 mg/dL Final   GFR, Estimated 07/20/2024 >60  >60 mL/min Final   Comment: (NOTE) Calculated using the CKD-EPI Creatinine Equation (2021)    Anion gap 07/20/2024 14  5 - 15 Final   Performed at [facility], [address] [place], [place] [address]   Alcohol, Ethyl (B) 07/20/2024 <15  <15 mg/dL Final   Comment: (NOTE) For medical purposes only. Performed at [facility], [address], [place], [place]  [address]    WBC 07/20/2024 10.1  4.0 - 10.5 K/uL Final   RBC 07/20/2024 4.91  4.22 - 5.81 MIL/uL Final   Hemoglobin 07/20/2024 14.0  13.0 - 17.0 g/dL Final   HCT 90/71/7974 40.9  39.0 - 52.0 % Final   MCV 07/20/2024 83.3  80.0 - 100.0 fL Final   MCH 07/20/2024 28.5  26.0 - 34.0 pg Final   MCHC 07/20/2024 34.2  30.0 - 36.0 g/dL Final   RDW 90/71/7974 12.1  11.5 - 15.5 % Final   Platelets 07/20/2024 280  150 - 400 K/uL Final   nRBC 07/20/2024 0.0  0.0 - 0.2 % Final   Performed at [facility], [address], [place], [place] [address]   Opiates 07/21/2024 NEGATIVE  NEGATIVE Final   Cocaine 07/21/2024 NEGATIVE  NEGATIVE Final   Benzodiazepines 07/21/2024 POSITIVE (A)  NEGATIVE Final   Amphetamines 07/21/2024 POSITIVE (A)  NEGATIVE Final   Tetrahydrocannabinol 07/21/2024 POSITIVE (A)  NEGATIVE Final   Barbiturates 07/21/2024 NEGATIVE  NEGATIVE Final   Methadone Scn, Ur 07/21/2024 NEGATIVE  NEGATIVE Final   Fentanyl  07/21/2024 NEGATIVE  NEGATIVE Final   Comment: (NOTE) Drug screen is for Medical Purposes only. Positive results are preliminary only. If confirmation is needed, notify lab within 5 days.  Drug Class                 Cutoff (ng/mL) Amphetamine and metabolites 1000 Barbiturate and metabolites 200 Benzodiazepine              200 Opiates and metabolites     300 Cocaine and metabolites     300 THC                         50 Fentanyl                     5 Methadone                   300  Trazodone  is metabolized in vivo to several metabolites,  including pharmacologically active m-CPP, which is excreted in the  urine.  Immunoassay screens for amphetamines and MDMA have potential  cross-reactivity with these compounds and may provide false positive  result.  Performed at [facility], [address], [place], [place] [address]    Salicylate Lvl 07/20/2024 <7.0 (L)  7.0 - 30.0 mg/dL Final   Performed at [facility],  [address] [place], [place] [address]   Acetaminophen  (Tylenol ), Serum 07/20/2024 <10 (L)  10 - 30 ug/mL Final  Comment: (NOTE) Toxic concentrations can be more effectively related to post dose interval; > 200, > 100, and > 50 ug/mL serum concentrations correspond to toxic concentrations at 4, 8, and 12 hours post dose, respectively.  Performed at [facility], [address] [place], [place] [address]    Specimen Description 07/21/2024    Final                   Value:URINE, CLEAN CATCH Performed at [facility], [address] [place], [place] [address]    Special Requests 07/21/2024    Final                   Value:NONE Performed at [facility], [address] [place], [place] [address]    Culture 07/21/2024  (A)   Final                   Value:<10,000 COLONIES/mL INSIGNIFICANT GROWTH Performed at [facility], [address], [place], [place] [address]    Report Status 07/21/2024 07/23/2024 FINAL   Final   SARS Coronavirus 2 by RT PCR 07/21/2024 NEGATIVE  NEGATIVE Final   Comment: (NOTE) SARS-CoV-2 target nucleic acids are NOT DETECTED.  The SARS-CoV-2 RNA is generally detectable in upper respiratory specimens during the acute phase of infection. The lowest concentration of SARS-CoV-2 viral copies this assay can detect is 138 copies/mL. A negative result does not preclude SARS-Cov-2 infection and should not be used as the sole basis for treatment or other patient management decisions. A negative result may occur with  improper specimen collection/handling, submission of specimen other than nasopharyngeal swab, presence of viral mutation(s) within the areas targeted by this assay, and inadequate number of viral copies(<138 copies/mL). A negative result must be combined with clinical observations, patient history, and epidemiological information. The expected result is Negative.  Fact Sheet for  Patients:  bloggercourse.com  Fact Sheet for Healthcare Providers:  seriousbroker.it  This test is no                          t yet approved or cleared by the [place]  FDA and  has been authorized for detection and/or diagnosis of SARS-CoV-2 by FDA under an Emergency Use Authorization (EUA). This EUA will remain  in effect (meaning this test can be used) for the duration of the COVID-19 declaration under Section 564(b)(1) of the Act, 21 U.S.C.section 360bbb-3(b)(1), unless the authorization is terminated  or revoked sooner.       Influenza A by PCR 07/21/2024 NEGATIVE  NEGATIVE Final   Influenza B by PCR 07/21/2024 NEGATIVE  NEGATIVE Final   Comment: (NOTE) The Xpert Xpress SARS-CoV-2/FLU/RSV plus assay is intended as an aid in the diagnosis of influenza from Nasopharyngeal swab specimens and should not be used as a sole basis for treatment. Nasal washings and aspirates are unacceptable for Xpert Xpress SARS-CoV-2/FLU/RSV testing.  Fact Sheet for Patients: bloggercourse.com  Fact Sheet for Healthcare Providers: seriousbroker.it  This test is not yet approved or cleared by the [place]  FDA and has been authorized for detection and/or diagnosis of SARS-CoV-2 by FDA under an Emergency Use Authorization (EUA). This EUA will remain in effect (meaning this test can be used) for the duration of the COVID-19 declaration under Section 564(b)(1) of the Act, 21 U.S.C. section 360bbb-3(b)(1), unless the authorization is terminated or revoked.     Resp Syncytial Virus by PCR  07/21/2024 NEGATIVE  NEGATIVE Final   Comment: (NOTE) Fact Sheet for Patients: bloggercourse.com  Fact Sheet for Healthcare Providers: seriousbroker.it  This test is not yet approved or cleared by the [place]  FDA and has been authorized for  detection and/or diagnosis of SARS-CoV-2 by FDA under an Emergency Use Authorization (EUA). This EUA will remain in effect (meaning this test can be used) for the duration of the COVID-19 declaration under Section 564(b)(1) of the Act, 21 U.S.C. section 360bbb-3(b)(1), unless the authorization is terminated or revoked.  Performed at [facility], [address] [place], [place] [address]   Admission on 04/13/2024, Discharged on 04/14/2024  Component Date Value Ref Range Status   WBC 04/14/2024 10.3  4.0 - 10.5 K/uL Final   RBC 04/14/2024 5.35  4.22 - 5.81 MIL/uL Final   Hemoglobin 04/14/2024 15.2  13.0 - 17.0 g/dL Final   HCT 93/76/7974 44.9  39.0 - 52.0 % Final   MCV 04/14/2024 83.9  80.0 - 100.0 fL Final   MCH 04/14/2024 28.4  26.0 - 34.0 pg Final   MCHC 04/14/2024 33.9  30.0 - 36.0 g/dL Final   RDW 93/76/7974 11.9  11.5 - 15.5 % Final   Platelets 04/14/2024 349  150 - 400 K/uL Final   nRBC 04/14/2024 0.0  0.0 - 0.2 % Final   Neutrophils Relative % 04/14/2024 51  % Final   Neutro Abs 04/14/2024 5.3  1.7 - 7.7 K/uL Final   Lymphocytes Relative 04/14/2024 36  % Final   Lymphs Abs 04/14/2024 3.7  0.7 - 4.0 K/uL Final   Monocytes Relative 04/14/2024 8  % Final   Monocytes Absolute 04/14/2024 0.8  0.1 - 1.0 K/uL Final   Eosinophils Relative 04/14/2024 5  % Final   Eosinophils Absolute 04/14/2024 0.5  0.0 - 0.5 K/uL Final   Basophils Relative 04/14/2024 0  % Final   Basophils Absolute 04/14/2024 0.0  0.0 - 0.1 K/uL Final   Immature Granulocytes 04/14/2024 0  % Final   Abs Immature Granulocytes 04/14/2024 0.02  0.00 - 0.07 K/uL Final   Performed at [facility], [address], [place], [place] [address]   Sodium 04/14/2024 138  135 - 145 mmol/L Final   Potassium 04/14/2024 3.6  3.5 - 5.1 mmol/L Final   Chloride 04/14/2024 101  98 - 111 mmol/L Final   CO2 04/14/2024 25  22 - 32 mmol/L Final   Glucose, Bld 04/14/2024 93  70 - 99 mg/dL Final   Glucose  reference range applies only to samples taken after fasting for at least 8 hours.   BUN 04/14/2024 13  6 - 20 mg/dL Final   Creatinine, Ser 04/14/2024 1.02  0.61 - 1.24 mg/dL Final   Calcium 93/76/7974 9.7  8.9 - 10.3 mg/dL Final   Total Protein 93/76/7974 7.1  6.5 - 8.1 g/dL Final   Albumin 93/76/7974 4.1  3.5 - 5.0 g/dL Final   AST 93/76/7974 21  15 - 41 U/L Final   ALT 04/14/2024 34  0 - 44 U/L Final   Alkaline Phosphatase 04/14/2024 54  38 - 126 U/L Final   Total Bilirubin 04/14/2024 0.6  0.0 - 1.2 mg/dL Final   GFR, Estimated 04/14/2024 >60  >60 mL/min Final   Comment: (NOTE) Calculated using the CKD-EPI Creatinine Equation (2021)    Anion gap 04/14/2024 12  5 - 15 Final   Performed at [facility], [address] [place], [place] [address]   Cholesterol  04/14/2024 131  0 - 200 mg/dL Final   Triglycerides 93/76/7974 109  <150 mg/dL Final   HDL 93/76/7974 52  >40 mg/dL Final   Total CHOL/HDL Ratio 04/14/2024 2.5  RATIO Final   VLDL 04/14/2024 22  0 - 40 mg/dL Final   LDL Cholesterol 04/14/2024 57  0 - 99 mg/dL Final   Comment:        Total Cholesterol/HDL:CHD Risk Coronary Heart Disease Risk Table                     Men   Women  1/2 Average Risk   3.4   3.3  Average Risk       5.0   4.4  2 X Average Risk   9.6   7.1  3 X Average Risk  23.4   11.0        Use the calculated Patient Ratio above and the CHD Risk Table to determine the patient's CHD Risk.        ATP III CLASSIFICATION (LDL):  <100     mg/dL   Optimal  899-870  mg/dL   Near or Above                    Optimal  130-159  mg/dL   Borderline  839-810  mg/dL   High  >809     mg/dL   Very High Performed at [facility], [address] [place], [place] [address]    Alcohol, Ethyl (B) 04/14/2024 <15  <15 mg/dL Final   Comment: (NOTE) For medical purposes only. Performed at [facility], [address], [place], [place] [address]    TSH 04/14/2024 3.430  0.350 - 4.500 uIU/mL Final    Comment: Performed by a 3rd Generation assay with a functional sensitivity of <=0.01 uIU/mL. Performed at [facility], [address], [place], [place] [address]    POC Amphetamine UR 04/14/2024 Positive (A)  NONE DETECTED (Cut Off Level 1000 ng/mL) Final   POC Secobarbital (BAR) 04/14/2024 None Detected  NONE DETECTED (Cut Off Level 300 ng/mL) Final   POC Buprenorphine (BUP) 04/14/2024 None Detected  NONE DETECTED (Cut Off Level 10 ng/mL) Final   POC Oxazepam (BZO) 04/14/2024 None Detected  NONE DETECTED (Cut Off Level 300 ng/mL) Final   POC Cocaine UR 04/14/2024 Positive (A)  NONE DETECTED (Cut Off Level 300 ng/mL) Final   POC Methamphetamine UR 04/14/2024 Positive (A)  NONE DETECTED (Cut Off Level 1000 ng/mL) Final   POC Morphine 04/14/2024 None Detected  NONE DETECTED (Cut Off Level 300 ng/mL) Final   POC Methadone UR 04/14/2024 None Detected  NONE DETECTED (Cut Off Level 300 ng/mL) Final   POC Oxycodone UR 04/14/2024 None Detected  NONE DETECTED (Cut Off Level 100 ng/mL) Final   POC Marijuana UR 04/14/2024 Positive (A)  NONE DETECTED (Cut Off Level 50 ng/mL) Final   Allergies: Penicillins and Sulfa antibiotics  Medications:  Facility Ordered Medications  Medication   acetaminophen  (TYLENOL ) tablet 650 mg   alum & mag hydroxide-simeth (MAALOX/MYLANTA) 200-200-20 MG/5ML suspension 30 mL   magnesium  hydroxide (MILK OF MAGNESIA) suspension 30 mL   haloperidol  (HALDOL ) tablet 5 mg   And   diphenhydrAMINE  (BENADRYL ) capsule 50 mg   haloperidol  lactate (HALDOL ) injection 10 mg   And   diphenhydrAMINE  (BENADRYL ) injection 50 mg   And   LORazepam  (ATIVAN ) injection 2 mg   haloperidol  lactate (HALDOL ) injection 5 mg  And   diphenhydrAMINE  (BENADRYL ) injection 50 mg   And   LORazepam  (ATIVAN ) injection 2 mg   PTA Medications  Medication Sig   hydrOXYzine  (VISTARIL ) 25 MG capsule Take 25 mg by mouth every 8 (eight) hours as needed for anxiety.   risperiDONE (RISPERDAL) 3 MG  tablet Take 3 mg by mouth at bedtime. (Patient not taking: Reported on 09/03/2024)   traZODone  (DESYREL ) 50 MG tablet Take 50 mg by mouth at bedtime. (Patient not taking: Reported on 09/03/2024)   benztropine (COGENTIN) 1 MG tablet Take 1 mg by mouth 2 (two) times daily. (Patient not taking: Reported on 09/03/2024)   topiramate (TOPAMAX) 25 MG tablet Take 25 mg by mouth 2 (two) times daily. (Patient not taking: Reported on 09/03/2024)   Medical Decision Making  -Recommended inpatient hospitalization for treatment and stabilization of mental status. -1 mg BID for mood stabilization & psychosis (home dose is 3 mg nightly) - Start Cogentin 0.5 mg twice daily (home dose is 1 mg twice daily) per chart review -Start trazodone  50 mg nightly for sleep -Hydroxyzine  25 mg 3 times daily as needed for anxiety -Agitation protocol as needed, as please see the MAR for details. -Ordered baseline labs: TSH, hemoglobin A1c, lipid panel, CMP, CBC, urinalysis, UDS.   Recommendations  Based on my evaluation the patient appears to have an emergency mental health condition for which I recommend the patient be transferred to an inpatient behavioral health unit for treatment and stabilization.   Donia Snell, NP 09/03/24  1:18 PM

## 2024-09-04 ENCOUNTER — Other Ambulatory Visit: Payer: Self-pay

## 2024-09-04 ENCOUNTER — Encounter (HOSPITAL_COMMUNITY): Payer: Self-pay | Admitting: Nurse Practitioner

## 2024-09-04 ENCOUNTER — Inpatient Hospital Stay (HOSPITAL_COMMUNITY)
Admission: AD | Admit: 2024-09-04 | Discharge: 2024-09-11 | DRG: 885 | Disposition: A | Discharge status: Rehabilitation Facility | Source: Intra-hospital | Attending: Student in an Organized Health Care Education/Training Program | Admitting: Student in an Organized Health Care Education/Training Program

## 2024-09-04 DIAGNOSIS — F5105 Insomnia due to other mental disorder: Secondary | ICD-10-CM | POA: Diagnosis present

## 2024-09-04 DIAGNOSIS — Z8659 Personal history of other mental and behavioral disorders: Secondary | ICD-10-CM | POA: Diagnosis not present

## 2024-09-04 DIAGNOSIS — T43596A Underdosing of other antipsychotics and neuroleptics, initial encounter: Secondary | ICD-10-CM | POA: Diagnosis present

## 2024-09-04 DIAGNOSIS — F1420 Cocaine dependence, uncomplicated: Secondary | ICD-10-CM | POA: Diagnosis present

## 2024-09-04 DIAGNOSIS — F22 Delusional disorders: Secondary | ICD-10-CM | POA: Diagnosis present

## 2024-09-04 DIAGNOSIS — Z79899 Other long term (current) drug therapy: Secondary | ICD-10-CM | POA: Diagnosis not present

## 2024-09-04 DIAGNOSIS — F1520 Other stimulant dependence, uncomplicated: Secondary | ICD-10-CM

## 2024-09-04 DIAGNOSIS — Z91128 Patient's intentional underdosing of medication regimen for other reason: Secondary | ICD-10-CM

## 2024-09-04 DIAGNOSIS — Z91199 Patient's noncompliance with other medical treatment and regimen due to unspecified reason: Secondary | ICD-10-CM

## 2024-09-04 DIAGNOSIS — F1011 Alcohol abuse, in remission: Secondary | ICD-10-CM

## 2024-09-04 DIAGNOSIS — T421X6A Underdosing of iminostilbenes, initial encounter: Secondary | ICD-10-CM | POA: Diagnosis present

## 2024-09-04 DIAGNOSIS — F1290 Cannabis use, unspecified, uncomplicated: Secondary | ICD-10-CM | POA: Diagnosis present

## 2024-09-04 DIAGNOSIS — F17210 Nicotine dependence, cigarettes, uncomplicated: Secondary | ICD-10-CM | POA: Diagnosis present

## 2024-09-04 DIAGNOSIS — T43226A Underdosing of selective serotonin reuptake inhibitors, initial encounter: Secondary | ICD-10-CM | POA: Diagnosis present

## 2024-09-04 DIAGNOSIS — F1010 Alcohol abuse, uncomplicated: Secondary | ICD-10-CM | POA: Diagnosis present

## 2024-09-04 DIAGNOSIS — F312 Bipolar disorder, current episode manic severe with psychotic features: Principal | ICD-10-CM

## 2024-09-04 DIAGNOSIS — F17220 Nicotine dependence, chewing tobacco, uncomplicated: Secondary | ICD-10-CM | POA: Diagnosis present

## 2024-09-04 DIAGNOSIS — J45909 Unspecified asthma, uncomplicated: Secondary | ICD-10-CM | POA: Diagnosis present

## 2024-09-04 DIAGNOSIS — Z8249 Family history of ischemic heart disease and other diseases of the circulatory system: Secondary | ICD-10-CM

## 2024-09-04 DIAGNOSIS — Z560 Unemployment, unspecified: Secondary | ICD-10-CM | POA: Diagnosis not present

## 2024-09-04 MED ORDER — HALOPERIDOL LACTATE 5 MG/ML IJ SOLN: 5.0000 mg | Freq: Three times a day (TID) | INTRAMUSCULAR | Status: DC | PRN

## 2024-09-04 MED ORDER — HALOPERIDOL LACTATE 5 MG/ML IJ SOLN: 10.0000 mg | Freq: Three times a day (TID) | INTRAMUSCULAR | Status: DC | PRN

## 2024-09-04 MED ORDER — DIPHENHYDRAMINE HCL 50 MG/ML IJ SOLN: 50.0000 mg | Freq: Three times a day (TID) | INTRAMUSCULAR | Status: DC | PRN

## 2024-09-04 MED ORDER — DIPHENHYDRAMINE HCL 25 MG PO CAPS: 50.0000 mg | ORAL_CAPSULE | Freq: Three times a day (TID) | ORAL | Status: DC | PRN

## 2024-09-04 MED ORDER — NICOTINE POLACRILEX 2 MG MT GUM
2.0000 mg | CHEWING_GUM | OROMUCOSAL | Status: DC | PRN
  Administered 2024-09-05: 2 mg via ORAL
  Filled 2024-09-04: qty 1

## 2024-09-04 MED ORDER — HYDROXYZINE HCL 50 MG PO TABS
50.0000 mg | ORAL_TABLET | Freq: Three times a day (TID) | ORAL | Status: DC | PRN
Start: 2024-09-04 — End: 2024-09-11
  Administered 2024-09-04 – 2024-09-10 (×8): 50 mg via ORAL
  Filled 2024-09-04 (×8): qty 1

## 2024-09-04 MED ORDER — QUETIAPINE FUMARATE 50 MG PO TABS
50.0000 mg | ORAL_TABLET | Freq: Every evening | ORAL | Status: DC | PRN
  Administered 2024-09-04 – 2024-09-07 (×2): 50 mg via ORAL
  Filled 2024-09-04 (×2): qty 1

## 2024-09-04 MED ORDER — LORAZEPAM 2 MG/ML IJ SOLN: 2.0000 mg | Freq: Three times a day (TID) | INTRAMUSCULAR | Status: DC | PRN

## 2024-09-04 MED ORDER — RISPERIDONE 1 MG PO TABS
1.0000 mg | ORAL_TABLET | Freq: Two times a day (BID) | ORAL | Status: DC
  Administered 2024-09-05 (×2): 1 mg via ORAL
  Filled 2024-09-04 (×4): qty 1

## 2024-09-04 MED ORDER — HALOPERIDOL 5 MG PO TABS: 5.0000 mg | ORAL_TABLET | Freq: Three times a day (TID) | ORAL | Status: DC | PRN

## 2024-09-04 NOTE — Progress Notes (Signed)
 Recreation Therapy Notes  INPATIENT RECREATION THERAPY ASSESSMENT  Patient Details Name: Timothy Figueroa MRN: [id number] DOB: September 03, 1996 Today's Date: 09/04/2024       Information Obtained From: Patient  Able to Participate in Assessment/Interview: Yes (Pt talks fast at times and can be hard to understand.)  Patient Presentation: Alert  Reason for Admission (Per Patient): Other (Comments) (mom IVC'd me)  Patient Stressors:  (None identified)  Coping Skills:   Journal, Sports, TV, Exercise, Music, Deep Breathing, Meditate, Talk, Avoidance, Read, Hot Bath/Shower, Other (Comment) (Pottery)  Leisure Interests (2+):  Community - Other (Comment) (hanging out)  Frequency of Recreation/Participation:  (depends)  Awareness of Community Resources:  Yes  Community Resources:  Research Scientist (physical Sciences), [place], [employer]  Current Use: Yes  If no, Barriers?:    Expressed Interest in [employer] Information: No  [employer] of Residence:  [place]  Patient Main Form of Transportation: Bicycle  Patient Strengths:  being retched?  Patient Identified Areas of Improvement:  no  Patient Goal for Hospitalization:  like to get out soon  Current SI (including self-harm):  No  Current HI:  No  Current AVH: No  Staff Intervention Plan: Group Attendance, Collaborate with Interdisciplinary Treatment Team  Consent to Intern Participation: N/A   Astraea Gaughran-McCall, LRT,CTRS Talya Quain A Mendel Binsfeld-McCall 09/04/2024, 1:22 PM

## 2024-09-04 NOTE — Progress Notes (Signed)
 Pt mostly isolative to room today. Pt in day room briefly to eat snack, pt observed responding to internal stimuli.

## 2024-09-04 NOTE — Progress Notes (Signed)
(  Sleep Hours) -8.25  (Any PRNs that were needed, meds refused, or side effects to meds)- Vistaril  50 mg , Seroquel 50 mg  (Any disturbances and when (visitation, over night)- none  (Concerns raised by the patient)- pt stated 'I feel I have to save the world pt continues to have Tangential thought in a disorganized state, with crying spells, Ask me 3 discussed with pt and verbal understanding stated.    (SI/HI/AVH)-denies

## 2024-09-04 NOTE — Tx Team (Signed)
 Initial Treatment Plan 09/04/2024 1:48 AM Timothy Figueroa [id number]    PATIENT STRESSORS: Marital or family conflict   Medication change or noncompliance   Substance abuse     PATIENT STRENGTHS: General fund of knowledge  Motivation for treatment/growth    PATIENT IDENTIFIED PROBLEMS: Manic  Paranoid  delusional  Sleep , leaving               DISCHARGE CRITERIA:  Improved stabilization in mood, thinking, and/or behavior Verbal commitment to aftercare and medication compliance  PRELIMINARY DISCHARGE PLAN: Attend aftercare/continuing care group Attend PHP/IOP Outpatient therapy  PATIENT/FAMILY INVOLVEMENT: This treatment plan has been presented to and reviewed with the patient, Timothy Figueroa.  The patient and family have been given the opportunity to ask questions and make suggestions.  [name] LABOR, RN 09/04/2024, 1:48 AM

## 2024-09-04 NOTE — Group Note (Signed)
 Recreation Therapy Group Note   Group Topic:Healthy Decision Making  Group Date: 09/04/2024 Start Time: 1015 End Time: 1040 Facilitators: Damany Eastman-McCall, LRT,CTRS Location: [address] Dayroom   Group Topic: Decision Making, Problem Solving, Communication  Goal Area(s) Addresses:  Patient will effectively work with peer towards shared goal.  Patient will identify factors that guided their decision making.  Patient will pro-socially communicate ideas during group session.   Behavioral Response:   Intervention: Survival Scenario - pencil, paper  Activity: Patients were given a scenario that they were going to be stranded [place] a [place] for several months before being rescued. Writer tasked them with making a list of 15 things they would choose to bring with them for survival. The list of items was prioritized most important to least. Each patient would come up with their own list, then work together to create a new list of 15 items while in a group of 3-5 peers. LRT discussed each person's list and how it differed from others. The debrief included discussion of priorities, good decisions versus bad decisions, and how it is important to think before acting so we can make the best decision possible. LRT tied the concept of effective communication among group members to patient's support systems outside of the hospital and its benefit post discharge.  Education: Pharmacist, Community, Priorities, Support System, Discharge Planning   Education Outcome: Acknowledges education/In group clarification/Needs additional education   Affect/Mood: N/A   Participation Level: Did not attend    Clinical Observations/Individualized Feedback:      Plan: Continue to engage patient in RT group sessions 2-3x/week.   Shonteria Abeln-McCall, LRT,CTRS  09/04/2024 12:33 PM

## 2024-09-04 NOTE — Plan of Care (Signed)
   Problem: Education: Goal: Emotional status will improve Outcome: Progressing Goal: Mental status will improve Outcome: Progressing   Problem: Activity: Goal: Interest or engagement in activities will improve Outcome: Progressing Goal: Sleeping patterns will improve Outcome: Progressing   Problem: Safety: Goal: Periods of time without injury will increase Outcome: Progressing

## 2024-09-04 NOTE — Progress Notes (Signed)
(  Sleep Hours) -3.75  (Any PRNs that were needed, meds refused, or side effects to meds)- none  (Any disturbances and when (visitation, over night)- none  (Concerns raised by the patient)- I want to leave  (SI/HI/AVH)- denies

## 2024-09-04 NOTE — Plan of Care (Signed)
  Problem: Activity: Goal: Interest or engagement in activities will improve Outcome: Progressing Goal: Sleeping patterns will improve Outcome: Progressing   Problem: Safety: Goal: Periods of time without injury will increase Outcome: Progressing

## 2024-09-04 NOTE — [facility] Group Notes (Signed)
 Adult Psychoeducational Group Note  Date:  09/04/2024 Time:  11:05 AM  Group Topic/Focus: Recreation Therapy Wellness Toolbox:   The focus of this group is to discuss various aspects of wellness, balancing those aspects and exploring ways to increase the ability to experience wellness.  Patients will create a wellness toolbox for use upon discharge.  Participation Level:  Did Not Attend  Participation Quality:    Affect:    Cognitive:    Insight:   Engagement in Group:    Modes of Intervention:    Additional Comments:    Carriann Hesse Lee 09/04/2024, 11:05 AM

## 2024-09-04 NOTE — Plan of Care (Signed)
  Problem: Education: Goal: Emotional status will improve Outcome: Progressing   Problem: Activity: Goal: Interest or engagement in activities will improve 09/04/2024 0258 by [name] LABOR, RN Outcome: Progressing 09/04/2024 0249 by [name] LABOR, RN Outcome: Progressing Goal: Sleeping patterns will improve 09/04/2024 0258 by [name] LABOR, RN Outcome: Progressing 09/04/2024 0249 by [name] LABOR, RN Outcome: Progressing   Problem: Safety: Goal: Periods of time without injury will increase Outcome: Progressing   Problem: Safety: Goal: Periods of time without injury will increase Outcome: Progressing

## 2024-09-04 NOTE — Plan of Care (Signed)
  Problem: Education: Goal: Emotional status will improve Outcome: Progressing   Problem: Activity: Goal: Interest or engagement in activities will improve 09/04/2024 0258 by [name] LABOR, RN Outcome: Progressing 09/04/2024 0249 by [name] LABOR, RN Outcome: Progressing Goal: Sleeping patterns will improve 09/04/2024 0258 by [name] LABOR, RN Outcome: Progressing 09/04/2024 0249 by [name] LABOR, RN Outcome: Progressing   Problem: Safety: Goal: Periods of time without injury will increase Outcome: Progressing   Problem: Safety: Goal: Periods of time without injury will increase 09/04/2024 0301 by [name] LABOR, RN Outcome: Progressing 09/04/2024 0258 by [name] LABOR, RN Outcome: Progressing   Problem: Coping: Goal: Ability to use eye contact when communicating with others will improve Outcome: Progressing

## 2024-09-04 NOTE — H&P (Signed)
 Psychiatric Admission Assessment Adult  Patient Identification: Timothy Figueroa MRN:  [id number] Date of Evaluation:  09/04/2024  Diagnoses:  Principal Problem:   Bipolar 1 disorder, severe, current or most recent episode manic, with psychotic features (HCC) Active Problems:   Severe stimulant use disorder (HCC)   History of alcohol abuse  Chief complaint: That woman isn't really my mother and she put me here on purpose.  History of Present Illness:  The patient is a 28 y.o. male (domiciled with parents, unemployed, has 57-year-old daughter) with no significant medical history and a psychiatric history significant for bipolar 1 disorder. Patient has had recurrent hospitalizations for mood and psychotic symptoms in the setting of severe substance use (meth, cocaine, and alcohol). The patient presented to [facility] escorted by police and under IVC due to concerns from family of him acting erratically and using multiple substances. Per IVC - patient has not been eating sleeping or attending to personal hygiene, is hallucinating, believes parents are pedophiles and are part of the epstein files, belives Ice and police are watching him, has been using meth, cocaine and alcohol. On arrival was grossly disorganized and paranoid, expressing multiple delusions, was tangential in TP, pressured in speech and was clearly hallucinating.. UDS positive for marijuana, cocaine, meth, and other amphetamines, U/A unremarkable. Additional labs ordered but not obtained due to non-cooperation. Lipids and TSH were done in June 2025 and WNL. IVC was upheld and [facility] evening of 11/12.   Psychiatric history: gained via patient (poor historian), family collateral (reliable) and chart review (reliable) Patient had a normal birth and development with no known head trauma, seizures or traumatic experiences. Performed well in school and up until the last year had been pursuing PHD in chemistry  (per mom everything needed to graduate except his dissertation).  Psychiatric history began in childhood with substance use concerns - smoking marijuana and primarily alcohol at that time. Mom reports in the context of being bullied. Per chart review, at age 65 he was already drinking excessively requiring time in rehab and with about 18 months sober prior to relapse and admission for detox. Patient himself has reported going to rehab several times (although not able to clearly state what and how much he was using) and only being admitted to psychiatry in this past year. Per mom, he was diagnosed with bipolar 1 disorder before he ever went into the hospital - this has been within the last 2 years. He has been placed on several medications that he has not taken. On chart review within these last few years he has additionally used stimulants including meth and cocaine in addition to alcohol.  Within this past year and worsening within the last 6 months, the patient has had 3 psychiatric admissions ([facility]) for symptoms concerning for mania with psychosis. This includes disorganization in thoughts/behaviors, grandiosity, decreased sleep (staking awake numerous days in a row), elevated/irritable mood, pressured speech, and delusions. He has been placed on several medications including: lexapro, risperidone, cariprazine, abilify, olanzapine , tegretol, depakote, and lamotrigine . No adherence outpatient to any of these medications per patient, family and chart review. Symptoms are primary elevated with no clear prior depressive episodes known. No history of suicide attempts or self-harming behavior.   Most recent [facility] - transferred to [facility] from the ED on 07/22/24- where he presented under IVC after parent's called 911 due to manic/psychotic behavior. Patient had been knocking on doors, patient himself called  EMS due to fears that neighbors were spying on him. He was seen  running up and down the street, shouting that he had guns. Patient had not slept in several days and required several PRN medications (geodon) in the ED prior to transfer to psychiatry for presumed manic episode. Since then, patient's reportedly most recent medication as vraylar, but has not been taking it.  Today: Today patient reports that he is not good because he is in the hospital. States that he found out he was being poisoned. When asked by who, says the woman who I thought was my mother but isn't. He reports that about 3 days ago he found out that the queen of [place] is really his mother. He states he is a [place] King. When asked about his psych history the patient denies all concerns, although does agree that he has been in the hospital several times this year. He attributes this to family being out to get him in some way and denies any actual need for medications. States all medications have side effects and he does not like them. Went through a list of several options and patient identified risperidone as the one that mostly okay, but still didn't like the way it made me feel. He hated tegretol and other antipsychotics and has no interest in lithium. Pan-denies all other concerns. Requests to leave the hospital and declines medications. Denies SI, HI and AVH.   Patient declines for this author to reach out to family as they are not really my family and I am never going back there.   Collateral Jerel [phone] (petitioner for original IVC). -Per mom this whole year he has been struggling. Just before this year he [facility] and the NP there with diagnosis of bipolar 1. They prescribed all sorts of things he refused to take. In Spring of this year (April 2025) he started sleeping less, became more anxious and then asked to be taken to [facility]. He was involuntarily committed and went to [facility] and discharged with medications that he took for  a little while and then stopped. At the end of September he was in the same place - not sleeping. At that time she thinks he had used different drugs and the neighbors actually called the police because he was knocking on people's doors, ended up going in an ambulance to the hospital and again to [facility].   This time he was placing coins all around the house, talking about electromagnetic field, being watched by an evil force. Was more and more convinced that parents have all these children that have been concealed from him. He spent the entire weekend packing up all his things to move out but has nowhere to go. No sleep in about a week. He slept last Friday. After these episodes he has not fully come back to himself - has remained with some level of paranoia. Patient last saw his outpatient psychiatrist on [facility]/26/24 [facility]   In 5th grade middle school he had some behavioral concerns. Started doing drugs at that time. He was being bullied and was dealing with substance use disorder for at least 15 years, if not longer. Sometime in the last 2 years he received the bipolar diagnosis, but its been within the last 6 months that things really got worse. 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   They are working with a  patient advocate through [facility] Sheilda: [phone].   Does have criminal charges right now. Had a DUI and no longer has a car.   Columbia Scale:  Flowsheet Row Ad[facility] ([facility]) [facility] from 09/03/2024 in [facility] from 07/20/2024 in [facility]  C-SSRS RISK CATEGORY No Risk No Risk No Risk    Alcohol Screening: 1. How often do you have a drink containing alcohol?: Monthly or less 2. How many drinks containing alcohol do you have on a typical day when you are drinking?: 3 or 4 3. How  often do you have six or more drinks on one occasion?: Less than monthly AUDIT-C Score: 3 4. How often during the last year have you found that you were not able to stop drinking once you had started?: Less than monthly 5. How often during the last year have you failed to do what was normally expected from you because of drinking?: Less than monthly 6. How often during the last year have you needed a first drink in the morning to get yourself going after a heavy drinking session?: Never 7. How often during the last year have you had a feeling of guilt of remorse after drinking?: Never 8. How often during the last year have you been unable to remember what happened the night before because you had been drinking?: Less than monthly 9. Have you or someone else been injured as a result of your drinking?: No 10. Has a relative or friend or a doctor or another health worker been concerned about your drinking or suggested you cut down?: Yes, but not in the last year Alcohol Use Disorder Identification Test Final Score (AUDIT): 8  Past Medical History:  Past Medical History:  Diagnosis Date   Allergy    Asthma    History reviewed. No pertinent surgical history. Family History:  Family History  Problem Relation Age of Onset   Healthy Mother    Hypertension Father    Cancer Paternal Grandfather    Family Psychiatric  History: denies Tobacco Screening:  Social History   Tobacco Use  Smoking Status Every Day   Types: Cigarettes  Smokeless Tobacco Current   Types: Chew    BH Tobacco Counseling     Are you interested in Tobacco Cessation Medications?  Yes, implement Nicotene Replacement Protocol Counseled patient on smoking cessation:  Refused/Declined practical counseling Reason Tobacco Screening Not Completed: No value filed.       Social History:  Social History   Substance and Sexual Activity  Alcohol Use Yes   Comment: 1/2 vodka past 2 days     Social History   Substance  and Sexual Activity  Drug Use Yes   Types: Marijuana, Amphetamines, Cocaine, Methamphetamines   Comment: daily     Allergies:   Allergies  Allergen Reactions   Penicillins Hives   Sulfa Antibiotics Hives   Lab Results:  Results for orders placed or performed during the hospital encounter of 09/03/24 (from the past 48 hours)  POCT Urine Drug Screen - (I-Screen)     Status: Abnormal   Collection Time: 09/03/24 12:44 PM  Result Value Ref Range   POC Amphetamine UR Positive (A) NONE DETECTED (Cut Off Level 1000 ng/mL)   POC Secobarbital (BAR) None Detected NONE DETECTED (Cut Off Level 300 ng/mL)   POC Buprenorphine (BUP) None Detected NONE DETECTED (Cut Off Level 10 ng/mL)  POC Oxazepam (BZO) None Detected NONE DETECTED (Cut Off Level 300 ng/mL)   POC Cocaine UR Positive (A) NONE DETECTED (Cut Off Level 300 ng/mL)   POC Methamphetamine UR Positive (A) NONE DETECTED (Cut Off Level 1000 ng/mL)   POC Morphine None Detected NONE DETECTED (Cut Off Level 300 ng/mL)   POC Methadone UR  NONE DETECTED (Cut Off Level 300 ng/mL)   POC Oxycodone UR None Detected NONE DETECTED (Cut Off Level 100 ng/mL)   POC Marijuana UR Positive (A) NONE DETECTED (Cut Off Level 50 ng/mL)  Urinalysis, Routine w reflex microscopic -Urine, Clean Catch     Status: None   Collection Time: 09/03/24  3:09 PM  Result Value Ref Range   Color, Urine YELLOW YELLOW   APPearance CLEAR CLEAR   Specific Gravity, Urine 1.011 1.005 - 1.030   pH 6.0 5.0 - 8.0   Glucose, UA NEGATIVE NEGATIVE mg/dL   Hgb urine dipstick NEGATIVE NEGATIVE   Bilirubin Urine NEGATIVE NEGATIVE   Ketones, ur NEGATIVE NEGATIVE mg/dL   Protein, ur NEGATIVE NEGATIVE mg/dL   Nitrite NEGATIVE NEGATIVE   Leukocytes,Ua NEGATIVE NEGATIVE   RBC / HPF 0-5 0 - 5 RBC/hpf   WBC, UA 0-5 0 - 5 WBC/hpf   Bacteria, UA NONE SEEN NONE SEEN   Squamous Epithelial / HPF 0-5 0 - 5 /HPF    Comment: Performed at [facility], [address] [place], [place]  [address]    Blood Alcohol level:  Lab Results  Component Value Date   [facility] <15 07/20/2024   ETH <15 04/14/2024    Metabolic Disorder Labs:  No results found for: HGBA1C, MPG No results found for: PROLACTIN Lab Results  Component Value Date   CHOL 131 04/14/2024   TRIG 109 04/14/2024   HDL 52 04/14/2024   CHOLHDL 2.5 04/14/2024   VLDL 22 04/14/2024   LDLCALC 57 04/14/2024    Current Medications: Current Facility-Administered Medications  Medication Dose Route Frequency Provider Last Rate Last Admin   haloperidol  (HALDOL ) tablet 5 mg  5 mg Oral TID PRN Onuoha, Chinwendu V, NP       And   diphenhydrAMINE  (BENADRYL ) capsule 50 mg  50 mg Oral TID PRN Onuoha, Chinwendu V, NP       haloperidol  lactate (HALDOL ) injection 5 mg  5 mg Intramuscular TID PRN Onuoha, Chinwendu V, NP       And   diphenhydrAMINE  (BENADRYL ) injection 50 mg  50 mg Intramuscular TID PRN Onuoha, Chinwendu V, NP       And   LORazepam  (ATIVAN ) injection 2 mg  2 mg Intramuscular TID PRN Onuoha, Chinwendu V, NP       haloperidol  lactate (HALDOL ) injection 10 mg  10 mg Intramuscular TID PRN Onuoha, Chinwendu V, NP       And   diphenhydrAMINE  (BENADRYL ) injection 50 mg  50 mg Intramuscular TID PRN Onuoha, Chinwendu V, NP       And   LORazepam  (ATIVAN ) injection 2 mg  2 mg Intramuscular TID PRN Onuoha, Chinwendu V, NP       nicotine polacrilex (NICORETTE) gum 2 mg  2 mg Oral PRN Bouchard, Marc A, DO       QUEtiapine (SEROQUEL) tablet 50 mg  50 mg Oral QHS PRN Tashaya Ancrum N, MD       risperiDONE (RISPERDAL) tablet 1 mg  1 mg Oral BID Mariajose Mow N, MD       PTA Medications: Medications Prior to Admission  Medication Sig Dispense Refill Last Dose/Taking   benztropine (COGENTIN) 1 MG tablet Take 1 mg by mouth 2 (two) times daily. (Patient not taking: Reported on 09/03/2024)      hydrOXYzine  (VISTARIL ) 25 MG capsule Take 25 mg by mouth every 8 (eight) hours as needed for anxiety.      risperiDONE (RISPERDAL) 3  MG tablet Take 3 mg by mouth at bedtime. (Patient not taking: Reported on 09/03/2024)      topiramate (TOPAMAX) 25 MG tablet Take 25 mg by mouth 2 (two) times daily. (Patient not taking: Reported on 09/03/2024)      traZODone  (DESYREL ) 50 MG tablet Take 50 mg by mouth at bedtime. (Patient not taking: Reported on 09/03/2024)       Mental Status exam: Appearance: tall white male, disheveled, seen sitting in bed and then moves around to eat snacks during conversation Eye contact: bizarre, intense at times, intermittent Attitude towards examiner: largely disengaged, aloof  Psychomotor: some agitation - getting up to pace, eat snacks  Speech: not pressured today but increased amount  Language: no delays  Mood: not good because I'm here Affect: bizarre  Thought content: denying SI and HI, expressing numerous delusions regarding family not being his family, being poisoned by them, being part of [place] royalty  Thought Process: disorganized, tangential and perseverative   Perception: denying AVH, not overtly RTIS but is preoccupied  Insight: poor - no insight into mental health concerns or reason for admission Judgement: poor based on recent events   Orientation: to self and place, does not answer date, not oriented to situation Attention/Concentration: limited due to disorganization Memory/Cognition: not formally assessed  Fund of Knowledge: Above average    Musculoskeletal: Strength & Muscle Tone: within normal limits Gait & Station: normal Patient leans: N/A   Physical Exam Constitutional:      Appearance: Normal appearance.  Abdominal:     General: There is no distension.  Musculoskeletal:        General: Normal range of motion.  Neurological:     General: No focal deficit present.     Mental Status: He is alert.    ROS Blood pressure 118/76, pulse 71, temperature 97.6 F (36.4 C), temperature source Oral, resp. rate 18, height 6' (1.829 m), weight 93.4 kg, SpO2 100%.  Body mass index is 27.94 kg/m.  Treatment Plan Summary: Daily contact with patient to assess and evaluate symptoms and progress in treatment  Assessment: The patient is a 28 y.o. male with a psychiatric history most consistent with bipolar 1 disorder, current episode manic, with psychotic features, stimulant use disorder, severe, and alcohol use disorder, unspecified. Patient has had recurrent admissions for manic symptoms including elevated/irritable mood, days without sleep (5-6 days of no sleep prior to this admission), grandiosity, increased energy and goal-directed behavior, and with psychotic symptoms including delusions and disorganized behavior. 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 He does have a history of at least 1-2 years of frequent usage of stimulants - both methamphetamines and cocaine. Although patient has denied usage of these substances, collateral indicates frequent use and UDS has been positive for these substances during hospital presentations. Given impact on mental health concerns and ongoing impairments in social/occupational functioning, stimulant use is considered severe at this time. Has previously carried an alcohol use disorder with prior rehab stays and admissions for detox. Unclear current pattern of usage, and problematic usage of stimulants appears to  be driving the picture so will remain as unspecified for now.   Today the patient presented with clear delusional content and disorganization in speech and thought process. In [facility] was significantly elevated and pressured. Per family he had not slept in the past 5-6 days. Patient currently has poor insight into his condition and need for treatment. He would likely benefit from LAI in the future to assist with adherence. Will begin risperidone 1 mg BID for this purpose. In addition, family has provided the contact number for a [facility]  patient advocate (see HPI above) that has agreed to facilitate and assist with getting patient into rehab. Currently the patient is not agreeable to signing ROI, so will likely revisit this when he is better able to engage in interview. Due to severity of substance usage, he would likely benefit from rehab after psychiatric stabilization   DSM-5 diagnoses: Bipolar 1 disorder, current episode manic, with psychotic features  Stimulant use disorder, severe, dependance (meth and cocaine) Alcohol use disorder, unspecified    Plan:  Legal Status: -Involuntary - under IVC, continued 09/04/24 @ 12:30 pm  Safety -q15 minute checks  -elopement, suicide and assault precautions  -daily vitals  Psychiatric Concerns  -Begin Risperidone 1 mg BID to address mania with psychosis -PRN quetiapine 50 mg at bedtime for sleep if needed  -PRN hydroxyzine  25 mg TID PRN for anxiety -Haldol /ativan /benadryl  agitation protocol   Substance use concerns  -History of alcohol use disorder. Ongoing methamphetamine and cocaine use (patient denies, but collateral reports usage and UDS positive for both)  -will expose to motivational interviewing when patient becomes more cognizant  -NAMI contact in HPI who is agreeable to   Nicotine Replacement  Nicorette gum PRN available   Medical concerns -No chronic medical conditions, will CTM  Additional PRNs: -Tylenol  tablets 650 mg every 6 hours as needed for pain -Maalox/Mylanta suspension 30 mL every 4 hours as needed for indigestion  -Milk of Magnesia 30 mL daily as needed for constipation  Labs -Reviewed as documented in HPI; -CBC, CMP and A1c ordered to be collected -September 2025 lipid panel and TSH WNL  Psychosocial interventions  -Motivational interviewing when patient is better able to engage  -daily medication management with psychiatry -Medication education regarding risks/benefits and alternatives -bedside psychotherapy as indicated  -Patient  will be encouraged to participate and engage with group therapy  -Appreciate SW assistance in coordinating safe disposition    I certify that inpatient services furnished can reasonably be expected to improve the patient's condition.    Leita LOISE Arts, MD 11/13/20253:15 PM
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
## 2024-09-04 NOTE — Plan of Care (Signed)
  Problem: Education: Goal: Knowledge of [facility] Education information/materials will improve Outcome: Progressing Goal: Emotional status will improve Outcome: Progressing Goal: Mental status will improve Outcome: Progressing Goal: Verbalization of understanding the information provided will improve Outcome: Progressing   Problem: Activity: Goal: Interest or engagement in activities will improve Outcome: Progressing Goal: Sleeping patterns will improve Outcome: Progressing   Problem: Coping: Goal: Ability to verbalize frustrations and anger appropriately will improve Outcome: Progressing Goal: Ability to demonstrate self-control will improve Outcome: Progressing   Problem: Health Behavior/Discharge Planning: Goal: Identification of resources available to assist in meeting health care needs will improve Outcome: Progressing Goal: Compliance with treatment plan for underlying cause of condition will improve Outcome: Progressing   Problem: Physical Regulation: Goal: Ability to maintain clinical measurements within normal limits will improve Outcome: Progressing   Problem: Safety: Goal: Periods of time without injury will increase Outcome: Progressing   Problem: Education: Goal: Knowledge of [facility] Education information/materials will improve Outcome: Progressing Goal: Emotional status will improve Outcome: Progressing Goal: Mental status will improve Outcome: Progressing Goal: Verbalization of understanding the information provided will improve Outcome: Progressing   Problem: Activity: Goal: Interest or engagement in activities will improve Outcome: Progressing Goal: Sleeping patterns will improve Outcome: Progressing   Problem: Coping: Goal: Ability to verbalize frustrations and anger appropriately will improve Outcome: Progressing Goal: Ability to demonstrate self-control will improve Outcome: Progressing   Problem: Health  Behavior/Discharge Planning: Goal: Identification of resources available to assist in meeting health care needs will improve Outcome: Progressing Goal: Compliance with treatment plan for underlying cause of condition will improve Outcome: Progressing   Problem: Physical Regulation: Goal: Ability to maintain clinical measurements within normal limits will improve Outcome: Progressing   Problem: Safety: Goal: Periods of time without injury will increase Outcome: Progressing   Problem: Activity: Goal: Will identify at least one activity in which they can participate Outcome: Progressing   Problem: Coping: Goal: Ability to identify and develop effective coping behavior will improve Outcome: Progressing Goal: Ability to interact with others will improve Outcome: Progressing Goal: Demonstration of participation in decision-making regarding own care will improve Outcome: Progressing Goal: Ability to use eye contact when communicating with others will improve Outcome: Progressing   Problem: Health Behavior/Discharge Planning: Goal: Identification of resources available to assist in meeting health care needs will improve Outcome: Progressing   Problem: Self-Concept: Goal: Will verbalize positive feelings about self Outcome: Progressing

## 2024-09-04 NOTE — [facility] Group Notes (Signed)
 Adult Psychoeducational Group Note  Date:  09/04/2024 Time:  11:24 AM  Group Topic/Focus: Nutrition Healthy Communication:   The focus of this group is to discuss communication, barriers to communication, as well as healthy ways to communicate with others.  Participation Level:  Did Not Attend  Participation Quality:    Affect:    Cognitive:    Insight:   Engagement in Group:    Modes of Intervention:    Additional Comments:    Jailine Lieder Lee 09/04/2024, 11:24 AM

## 2024-09-04 NOTE — Group Note (Signed)
 Date:  09/04/2024 Time:  8:34 PM  Group Topic/Focus:  Wrap-Up Group:   The focus of this group is to help patients review their daily goal of treatment and discuss progress on daily workbooks.    Participation Level:  Active  Participation Quality:  Appropriate  Affect:  Appropriate  Cognitive:  Appropriate  Insight: Appropriate  Engagement in Group:  Engaged  Modes of Intervention:  Education and Exploration  Additional Comments:  Patient attended and participate in group tonight.  He reports that today he eat and sleep.  Gwenn Chillington Dacosta 09/04/2024, 8:34 PM

## 2024-09-04 NOTE — [facility] Group Notes (Signed)
 Adult Psychoeducational Group Note  Date:  09/04/2024 Time:  1:29 PM  Group Topic/Focus: [employer] Recovery Goals:   The focus of this group is to identify appropriate goals for recovery and establish a plan to achieve them.  Participation Level:  Did Not Attend  Participation Quality:    Affect:    Cognitive:    Insight:   Engagement in Group:    Modes of Intervention:    Additional Comments:    Chimere Klingensmith Lee 09/04/2024, 1:29 PM

## 2024-09-04 NOTE — [facility] Group Notes (Signed)
 Adult Psychoeducational Group Note  Date:  09/04/2024 Time:  4:09 PM  Group Topic/Focus: Occupational Therapy Self Care:   The focus of this group is to help patients understand the importance of self-care in order to improve or restore emotional, physical, spiritual, interpersonal, and financial health.  Participation Level:    Participation Quality:    Affect:    Cognitive:    Insight:   Engagement in Group:    Modes of Intervention:    Additional Comments:    Pryce Folts Lee 09/04/2024, 4:09 PM

## 2024-09-04 NOTE — [facility] Suicide Risk Assessment (Signed)
 [facility] Admission Suicide and Homicide Risk Assessment   Suicide: Patient presents with acute risk factors including AMS/psychosis. He carries additional chronic risk factors of recurrent hospitalizations, history of mental illness (bipolar disorder) ongoing severe substance use concerns (stimulants and alcohol). At this time there is low risk of suicide. However, patient is at high risk of retaliatory violence based on ongoing psychotic symptoms with bizarre/aggressive behavior.   Homicide: Patient presents with acute risk factors including active psychotic symptoms, delusions of being poisoned, specific family members he believes are targeting him. He has had episodes of agitated behavior. Patient has a history of requiring PRN medications when manic/psychotic due to agitated/aggressive behavior. At this time risk to others is considered moderate/high  To address acute manic/psychotic symptoms the patient will be admitted to inpatient psychiatry on involuntary hold with medications restarted.  I certify that inpatient services furnished can reasonably be expected to improve the patient's condition.   Leita LOISE Arts, MD 09/04/2024, 3:16 PM

## 2024-09-05 ENCOUNTER — Encounter (HOSPITAL_COMMUNITY): Payer: Self-pay

## 2024-09-05 LAB — CBC WITH DIFFERENTIAL/PLATELET
Abs Immature Granulocytes: 0.01 K/uL (ref 0.00–0.07)
Basophils Absolute: 0 K/uL (ref 0.0–0.1)
Basophils Relative: 0 %
Eosinophils Absolute: 0.4 K/uL (ref 0.0–0.5)
Eosinophils Relative: 5 %
HCT: 45.1 % (ref 39.0–52.0)
Hemoglobin: 14.9 g/dL (ref 13.0–17.0)
Immature Granulocytes: 0 %
Lymphocytes Relative: 42 %
Lymphs Abs: 3.1 K/uL (ref 0.7–4.0)
MCH: 28.3 pg (ref 26.0–34.0)
MCHC: 33 g/dL (ref 30.0–36.0)
MCV: 85.6 fL (ref 80.0–100.0)
Monocytes Absolute: 0.5 K/uL (ref 0.1–1.0)
Monocytes Relative: 7 %
Neutro Abs: 3.4 K/uL (ref 1.7–7.7)
Neutrophils Relative %: 46 %
Platelets: 303 K/uL (ref 150–400)
RBC: 5.27 MIL/uL (ref 4.22–5.81)
RDW: 12.2 % (ref 11.5–15.5)
WBC: 7.5 K/uL (ref 4.0–10.5)
nRBC: 0 % (ref 0.0–0.2)

## 2024-09-05 LAB — COMPREHENSIVE METABOLIC PANEL WITH GFR
ALT: 22 U/L (ref 0–44)
AST: 16 U/L (ref 15–41)
Albumin: 4 g/dL (ref 3.5–5.0)
Alkaline Phosphatase: 61 U/L (ref 38–126)
Anion gap: 9 (ref 5–15)
BUN: 9 mg/dL (ref 6–20)
CO2: 26 mmol/L (ref 22–32)
Calcium: 9.5 mg/dL (ref 8.9–10.3)
Chloride: 105 mmol/L (ref 98–111)
Creatinine, Ser: 0.99 mg/dL (ref 0.61–1.24)
GFR, Estimated: >60 mL/min (ref >60)
Glucose, Bld: 85 mg/dL (ref 70–99)
Potassium: 3.9 mmol/L (ref 3.5–5.1)
Sodium: 140 mmol/L (ref 135–145)
Total Bilirubin: 0.5 mg/dL (ref 0.0–1.2)
Total Protein: 6.8 g/dL (ref 6.5–8.1)

## 2024-09-05 LAB — HEMOGLOBIN A1C
Hgb A1c MFr Bld: 4.8 % (ref 4.8–5.6)
Mean Plasma Glucose: 91.06 mg/dL

## 2024-09-05 MED ORDER — QUETIAPINE FUMARATE ER 50 MG PO TB24
50.0000 mg | ORAL_TABLET | Freq: Every day | ORAL | Status: DC
  Administered 2024-09-05: 50 mg via ORAL
  Filled 2024-09-05: qty 1

## 2024-09-05 NOTE — Progress Notes (Signed)
   09/05/24 1100  Psych Admission Type (Psych Patients Only)  Admission Status Involuntary  Psychosocial Assessment  Patient Complaints Crying spells;Anxiety  Eye Contact Brief  Facial Expression Animated;Anxious  Affect Preoccupied  Speech Tangential  Interaction Minimal  Motor Activity Fidgety  Appearance/Hygiene Disheveled  Behavior Characteristics Resistant to care  Mood Preoccupied  Thought Process  Coherency Disorganized  Content Blaming others;Delusions;Preoccupation  Delusions Paranoid  Perception Hallucinations  Hallucination Auditory  Judgment Poor  Confusion None  Danger to Self  Current suicidal ideation? Denies  Danger to Others  Danger to Others None reported or observed

## 2024-09-05 NOTE — Group Note (Addendum)
 Date:  09/05/2024 Time:  12:45 PM  Group Topic/Focus: Physical Wellness Self Care:   The focus of this group is to help patients understand the importance of self-care in order to improve or restore emotional, physical, spiritual, interpersonal, and financial health.    Participation Level:  Did Not Attend  Participation Quality:  NA  Affect:  NA  Cognitive:  NA  Insight: NA  Engagement in Group:  NA  Modes of Intervention:  NA  Additional Comments:  Patient did not attend Physical Wellness Group.  Timothy Figueroa Timothy Figueroa 09/05/2024, 12:45 PM

## 2024-09-05 NOTE — Progress Notes (Signed)
 Pt in bed most of day. Pt eating meals, did go outside with peers. 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 Pts speech at times is disorganized and difficult to follow. Pt medication compliant today. Pt cried throughout medication pass. Support and encouragement offered.

## 2024-09-05 NOTE — Plan of Care (Signed)
  Problem: Education: Goal: Emotional status will improve Outcome: Progressing Goal: Verbalization of understanding the information provided will improve Outcome: Progressing   Problem: Activity: Goal: Interest or engagement in activities will improve Outcome: Progressing Goal: Sleeping patterns will improve Outcome: Progressing   Problem: Coping: Goal: Ability to demonstrate self-control will improve Outcome: Progressing   Problem: Health Behavior/Discharge Planning: Goal: Compliance with treatment plan for underlying cause of condition will improve Outcome: Progressing   Problem: Safety: Goal: Periods of time without injury will increase Outcome: Progressing   Problem: Education: Goal: Mental status will improve Outcome: Not Progressing

## 2024-09-05 NOTE — [facility] Suicide Risk Assessment (Addendum)
 [facility] INPATIENT:  Family/Significant Other Suicide Prevention Education  Suicide Prevention Education:  Education Completed; Timothy Figueroa (dad) [phone], (name of family member/significant other) has been identified by the patient as the family member/significant other with whom the patient will be residing, and identified as the person(s) who will aid the patient in the event of a mental health crisis (suicidal ideations/suicide attempt).  With written consent from the patient, the family member/significant other has been provided the following suicide prevention education, prior to the and/or following the discharge of the patient.  Father said that he and patient's mom hired a research scientist (medical) who is looking for dual diagnosis (mental health and substance use) 30-day program for patient.  The consultant informed the father that a placement could be available next week.  Consultant:  RODGER Cadet: [phone]. Patient advocate through [employer].  Father said that patient can't be discharged to his home if he refuses to participate in inpatient treatment.  Father said patient doesn't have access to guns or weapons, and there are no guns or weapons in the home.  Father said this is patient's third hospitalization since June.  The suicide prevention education provided includes the following: Suicide risk factors Suicide prevention and interventions National Suicide Hotline telephone number [facility] assessment telephone number [facility] Emergency Assistance 911 [facility] and/or Residential Mobile Crisis Unit telephone number  Request made of family/significant other to: Remove weapons (e.g., guns, rifles, knives), all items previously/currently identified as safety concern.     The family member/significant other verbalizes understanding of the suicide prevention education information provided.  The family member/significant other agrees to remove the items of safety concern  listed above.  Timothy Figueroa, LCSWA 09/05/2024, 7:28 PM

## 2024-09-05 NOTE — Group Note (Signed)
 Date:  09/05/2024 Time:  9:20 AM  Group Topic/Focus:  Goals Group:   The focus of this group is to help patients establish daily goals to achieve during treatment and discuss how the patient can incorporate goal setting into their daily lives to aide in recovery.    Participation Level:  Did Not Attend  Participation Quality:  NA  Affect:  NA  Cognitive:  NA  Insight: NA  Engagement in Group:  NA  Modes of Intervention:  NA  Additional Comments:  Patient did not attend Goals Group.  Rosaleen BIRCH Cederick Broadnax 09/05/2024, 9:20 AM

## 2024-09-05 NOTE — Progress Notes (Addendum)
 Conversation with patient:  CSW asked patient if she could speak with  JD Doubs: [phone]. Patient advocate through [employer] environmental education officer who is looking for dual diagnosis placement for patient).  Patient said that he will call them first, and then will discuss it with CSW (currently, CSW doesn't have permission to speak with the consultant).     Kendallyn Lippold, LCSWA 09/05/2024

## 2024-09-05 NOTE — [facility] Group Notes (Signed)
 Patient did not attend Recreational Therapy Group.
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
## 2024-09-05 NOTE — Plan of Care (Signed)
  Problem: Education: Goal: Knowledge of [facility] Education information/materials will improve Outcome: Progressing Goal: Emotional status will improve Outcome: Progressing Goal: Mental status will improve Outcome: Progressing Goal: Verbalization of understanding the information provided will improve Outcome: Progressing   Problem: Activity: Goal: Interest or engagement in activities will improve Outcome: Progressing Goal: Sleeping patterns will improve Outcome: Progressing   Problem: Coping: Goal: Ability to verbalize frustrations and anger appropriately will improve Outcome: Progressing Goal: Ability to demonstrate self-control will improve Outcome: Progressing   Problem: Health Behavior/Discharge Planning: Goal: Identification of resources available to assist in meeting health care needs will improve Outcome: Progressing Goal: Compliance with treatment plan for underlying cause of condition will improve Outcome: Progressing   Problem: Physical Regulation: Goal: Ability to maintain clinical measurements within normal limits will improve Outcome: Progressing   Problem: Safety: Goal: Periods of time without injury will increase Outcome: Progressing   Problem: Education: Goal: Knowledge of [facility] Education information/materials will improve Outcome: Progressing Goal: Emotional status will improve Outcome: Progressing Goal: Mental status will improve Outcome: Progressing Goal: Verbalization of understanding the information provided will improve Outcome: Progressing   Problem: Activity: Goal: Interest or engagement in activities will improve Outcome: Progressing Goal: Sleeping patterns will improve Outcome: Progressing   Problem: Coping: Goal: Ability to verbalize frustrations and anger appropriately will improve Outcome: Progressing Goal: Ability to demonstrate self-control will improve Outcome: Progressing   Problem: Health  Behavior/Discharge Planning: Goal: Identification of resources available to assist in meeting health care needs will improve Outcome: Progressing Goal: Compliance with treatment plan for underlying cause of condition will improve Outcome: Progressing   Problem: Physical Regulation: Goal: Ability to maintain clinical measurements within normal limits will improve Outcome: Progressing   Problem: Safety: Goal: Periods of time without injury will increase Outcome: Progressing   Problem: Activity: Goal: Will identify at least one activity in which they can participate Outcome: Progressing   Problem: Coping: Goal: Ability to identify and develop effective coping behavior will improve Outcome: Progressing Goal: Ability to interact with others will improve Outcome: Progressing Goal: Demonstration of participation in decision-making regarding own care will improve Outcome: Progressing Goal: Ability to use eye contact when communicating with others will improve Outcome: Progressing   Problem: Health Behavior/Discharge Planning: Goal: Identification of resources available to assist in meeting health care needs will improve Outcome: Progressing   Problem: Self-Concept: Goal: Will verbalize positive feelings about self Outcome: Progressing

## 2024-09-05 NOTE — Progress Notes (Addendum)
(  Sleep Hours) -10  (Any PRNs that were needed, meds refused, or side effects to meds)- Vistaril  50 mg   (Any disturbances and when (visitation, over night)- none  (Concerns raised by the patient)-  pt would like his Risperdal moved to night time due to it making him sleepy, Ask me 3 discussed with pt and verbal understanding stated.    (SI/HI/AVH)- AH

## 2024-09-05 NOTE — Group Note (Signed)
 Date:  09/05/2024 Time:  8:19 PM  Group Topic/Focus:  Wrap-Up Group:   The focus of this group is to help patients review their daily goal of treatment and discuss progress on daily workbooks.    Participation Level:  Active  Participation Quality:  Appropriate  Affect:  Appropriate  Cognitive:  Appropriate  Insight: Appropriate  Engagement in Group:  Engaged  Modes of Intervention:  Education and Exploration  Additional Comments:  Patient attended and participated in group tonight. He reports that his goal for today was to find out what his discharge date will be. He did not find out his discharge date.  Gwenn Chillington Dacosta 09/05/2024, 8:19 PM

## 2024-09-05 NOTE — BH IP Treatment Plan (Signed)
 Interdisciplinary Treatment and Diagnostic Plan Update  09/05/2024 Time of Session: 10:35 AM Timothy Figueroa MRN: [id number]  Principal Diagnosis: Bipolar 1 disorder, severe, current or most recent episode manic, with psychotic features (HCC)  Secondary Diagnoses: Principal Problem:   Bipolar 1 disorder, severe, current or most recent episode manic, with psychotic features (HCC) Active Problems:   Severe stimulant use disorder (HCC)   History of alcohol abuse   Current Medications:  Current Facility-Administered Medications  Medication Dose Route Frequency Provider Last Rate Last Admin   haloperidol  (HALDOL ) tablet 5 mg  5 mg Oral TID PRN Onuoha, Chinwendu V, NP       And   diphenhydrAMINE  (BENADRYL ) capsule 50 mg  50 mg Oral TID PRN Onuoha, Chinwendu V, NP       haloperidol  lactate (HALDOL ) injection 5 mg  5 mg Intramuscular TID PRN Onuoha, Chinwendu V, NP       And   diphenhydrAMINE  (BENADRYL ) injection 50 mg  50 mg Intramuscular TID PRN Onuoha, Chinwendu V, NP       And   LORazepam  (ATIVAN ) injection 2 mg  2 mg Intramuscular TID PRN Onuoha, Chinwendu V, NP       haloperidol  lactate (HALDOL ) injection 10 mg  10 mg Intramuscular TID PRN Onuoha, Chinwendu V, NP       And   diphenhydrAMINE  (BENADRYL ) injection 50 mg  50 mg Intramuscular TID PRN Onuoha, Chinwendu V, NP       And   LORazepam  (ATIVAN ) injection 2 mg  2 mg Intramuscular TID PRN Onuoha, Chinwendu V, NP       hydrOXYzine  (ATARAX ) tablet 50 mg  50 mg Oral TID PRN Butler, Laura N, MD   50 mg at 09/04/24 2059   nicotine polacrilex (NICORETTE) gum 2 mg  2 mg Oral PRN Prentis Kitchens A, DO       QUEtiapine (SEROQUEL XR) 24 hr tablet 50 mg  50 mg Oral QHS Towana Leita SAILOR, MD       QUEtiapine (SEROQUEL) tablet 50 mg  50 mg Oral QHS PRN Butler, Laura N, MD   50 mg at 09/04/24 2059   risperiDONE (RISPERDAL) tablet 1 mg  1 mg Oral BID Butler, Laura N, MD   1 mg at 09/05/24 0911   PTA Medications: Medications Prior to Admission   Medication Sig Dispense Refill Last Dose/Taking   benztropine (COGENTIN) 1 MG tablet Take 1 mg by mouth 2 (two) times daily. (Patient not taking: Reported on 09/03/2024)      hydrOXYzine  (VISTARIL ) 25 MG capsule Take 25 mg by mouth every 8 (eight) hours as needed for anxiety.      risperiDONE (RISPERDAL) 3 MG tablet Take 3 mg by mouth at bedtime. (Patient not taking: Reported on 09/03/2024)      topiramate (TOPAMAX) 25 MG tablet Take 25 mg by mouth 2 (two) times daily. (Patient not taking: Reported on 09/03/2024)      traZODone  (DESYREL ) 50 MG tablet Take 50 mg by mouth at bedtime. (Patient not taking: Reported on 09/03/2024)       Patient Stressors: Marital or family conflict   Medication change or noncompliance   Substance abuse    Patient Strengths: General fund of knowledge  Motivation for treatment/growth   Treatment Modalities: Medication Management, Group therapy, Case management,  1 to 1 session with clinician, Psychoeducation, Recreational therapy.   Physician Treatment Plan for Primary Diagnosis: Bipolar 1 disorder, severe, current or most recent episode manic, with psychotic features (HCC) Long Term  Goal(s):     Short Term Goals:    Medication Management: Evaluate patient's response, side effects, and tolerance of medication regimen.  Therapeutic Interventions: 1 to 1 sessions, Unit Group sessions and Medication administration.  Evaluation of Outcomes: Not Progressing  Physician Treatment Plan for Secondary Diagnosis: Principal Problem:   Bipolar 1 disorder, severe, current or most recent episode manic, with psychotic features (HCC) Active Problems:   Severe stimulant use disorder (HCC)   History of alcohol abuse  Long Term Goal(s):     Short Term Goals:       Medication Management: Evaluate patient's response, side effects, and tolerance of medication regimen.  Therapeutic Interventions: 1 to 1 sessions, Unit Group sessions and Medication  administration.  Evaluation of Outcomes: Not Progressing   RN Treatment Plan for Primary Diagnosis: Bipolar 1 disorder, severe, current or most recent episode manic, with psychotic features (HCC) Long Term Goal(s): Knowledge of disease and therapeutic regimen to maintain health will improve  Short Term Goals: Ability to remain free from injury will improve, Ability to verbalize frustration and anger appropriately will improve, Ability to demonstrate self-control, Ability to participate in decision making will improve, Ability to verbalize feelings will improve, Ability to disclose and discuss suicidal ideas, Ability to identify and develop effective coping behaviors will improve, and Compliance with prescribed medications will improve  Medication Management: RN will administer medications as ordered by provider, will assess and evaluate patient's response and provide education to patient for prescribed medication. RN will report any adverse and/or side effects to prescribing provider.  Therapeutic Interventions: 1 on 1 counseling sessions, Psychoeducation, Medication administration, Evaluate responses to treatment, Monitor vital signs and CBGs as ordered, Perform/monitor CIWA, COWS, AIMS and Fall Risk screenings as ordered, Perform wound care treatments as ordered.  Evaluation of Outcomes: Not Progressing   LCSW Treatment Plan for Primary Diagnosis: Bipolar 1 disorder, severe, current or most recent episode manic, with psychotic features (HCC) Long Term Goal(s): Safe transition to appropriate next level of care at discharge, Engage patient in therapeutic group addressing interpersonal concerns.  Short Term Goals: Engage patient in aftercare planning with referrals and resources, Increase social support, Increase ability to appropriately verbalize feelings, Increase emotional regulation, Facilitate acceptance of mental health diagnosis and concerns, Facilitate patient progression through stages of  change regarding substance use diagnoses and concerns, Identify triggers associated with mental health/substance abuse issues, and Increase skills for wellness and recovery  Therapeutic Interventions: Assess for all discharge needs, 1 to 1 time with Social worker, Explore available resources and support systems, Assess for adequacy in community support network, Educate family and significant other(s) on suicide prevention, Complete Psychosocial Assessment, Interpersonal group therapy.  Evaluation of Outcomes: Not Progressing   Progress in Treatment: Attending groups: No. Participating in groups: No. Taking medication as prescribed: Yes. Toleration medication: Yes. Family/Significant other contact made: Yes, individual(s) contacted:  Tarius Stangelo (dad) [phone] Patient understands diagnosis: No. Discussing patient identified problems/goals with staff: No. Medical problems stabilized or resolved: Yes. Denies suicidal/homicidal ideation: Yes. Issues/concerns per patient self-inventory: No.  New problem(s) identified:  No  New Short Term/Long Term Goal(s):    medication stabilization, elimination of SI thoughts, development of comprehensive mental wellness plan.    Patient Goals:  Patient was unable to participate in the treatment team meeting due to somnolence.  Discharge Plan or Barriers:  Patient recently admitted. CSW will continue to follow and assess for appropriate referrals and possible discharge planning.    Reason for Continuation of Hospitalization: Medication stabilization  Withdrawal symptoms  Estimated Length of Stay:  5 - 7 days   Last 3 [place] Suicide Severity Risk Score: Flowsheet Row [facility] (Current[facility] from 09/03/2024 in [facility] ED from 07/20/2024 in [facility] Emergency Department [facility]  C-SSRS RISK CATEGORY No Risk No Risk No Risk     Last [facility] 2/9 Scores:     No data to display          Scribe for Treatment Team: Delisha Peaden O Mehar Kirkwood, LCSWA 09/05/2024 5:46 PM

## 2024-09-05 NOTE — Progress Notes (Signed)
 D: Pt denies SI/HI/AVH. Pt is pleasant and cooperative. Pt continues to have crying spells at times, pt reluctant to take HS medication, but complies.   A: Pt was offered support and encouragement. Pt was given scheduled medications. Pt was encourage to attend groups. Q 15 minute checks were done for safety. Ask me 3 discussed with pt and verbal understanding stated.    R:Pt attends groups and interacts well with peers and staff. Pt is taking medication. Pt has no complaints.Pt receptive to treatment and safety maintained on unit. Pt visible on the milieu some this evening.

## 2024-09-05 NOTE — Group Note (Signed)
 Recreation Therapy Group Note   Group Topic:Problem Solving  Group Date: 09/05/2024 Start Time: 1040 End Time: 1100 Facilitators: Liticia Gasior-McCall, LRT,CTRS Location: 500 [address] Dayroom   Group Topic: Problem Solving  Goal Area(s) Addresses:  Patient will verbalize importance of using appropriate problem solving techniques.  Patient will identify positive change associated with effective problem solving skills.   Behavioral Response:   Intervention: Worksheet, Pencils  Activity: [employer] Patients were given a worksheet with 24 words relevant to fall scrambled. Patients were to unscramble the letters to identify the word.    Education Outcome: Acknowledges understanding/In group clarification offered/Needs additional education.    Affect/Mood: N/A   Participation Level: Did not attend    Clinical Observations/Individualized Feedback:      Plan: Continue to engage patient in RT group sessions 2-3x/week.   Debraann Livingstone-McCall, LRT,CTRS 09/05/2024 1:12 PM

## 2024-09-05 NOTE — B[facility] Group Notes (Signed)
 Patient did not attend Spiritual Wellness Group by Chaplan.

## 2024-09-06 MED ORDER — RISPERIDONE 3 MG PO TABS
3.0000 mg | ORAL_TABLET | Freq: Every day | ORAL | Status: DC
  Administered 2024-09-06 – 2024-09-08 (×3): 3 mg via ORAL
  Filled 2024-09-06 (×3): qty 1

## 2024-09-06 MED ORDER — RISPERIDONE 3 MG PO TABS: 3.0000 mg | ORAL_TABLET | Freq: Every day | ORAL | Status: DC

## 2024-09-06 NOTE — Group Note (Signed)
 Date:  09/06/2024 Time:  8:12 PM  Group Topic/Focus:  Wrap-Up Group:   The focus of this group is to help patients review their daily goal of treatment and discuss progress on daily workbooks.    Participation Level:  Active  Participation Quality:  Appropriate  Affect:  Appropriate  Cognitive:  Appropriate  Insight: Appropriate  Engagement in Group:  Engaged  Modes of Intervention:  Education and Exploration  Additional Comments:  Patient attended and participated in group tonight. He reports that hhis goal was to talk to his family and he did.  Gwenn Chillington Dacosta 09/06/2024, 8:12 PM

## 2024-09-06 NOTE — Group Note (Deleted)
 Date:  09/06/2024 Time:  8:35 AM  Group Topic/Focus: Social wellness and orientation goals Group Goals Group:   The focus of this group is to help patients establish daily goals to achieve during treatment and discuss how the patient can incorporate goal setting into their daily lives to aide in recovery. Orientation:   The focus of this group is to educate the patient on the purpose and policies of crisis stabilization and provide a format to answer questions about their admission.  The group details unit policies and expectations of patients while admitted.     Participation Level:  {BHH PARTICIPATION OZCZO:77735}  Participation Quality:  {B[facility] PARTICIPATION QUALITY:22265}  Affect:  {BHH AFFECT:22266}  Cognitive:  {BHH COGNITIVE:22267}  Insight: {BHH Insight2:20797}  Engagement in Group:  {BHH ENGAGEMENT IN HMNLE:77731}  Modes of Intervention:  {BHH MODES OF INTERVENTION:22269}  Additional Comments:  ***  Timothy Figueroa Timothy Figueroa 09/06/2024, 8:35 AM

## 2024-09-06 NOTE — Progress Notes (Signed)
 Tour of Duty:  Prentice JINNY Angle, RN, 09/06/24, Tour of Duty: 0700-1900  SI/HI/AVH: Denies  Self-Reported   Mood: Negative, labile Anxiety: Endorses Depression: Denies, but Observable Irritability: Denies, but Observable  Broset  Violence Prevention Guidelines *See Row Information*: Moderate Violence Risk interventions implemented   LBM  Last BM Date : 09/05/24   Pain: not present  Patient Refusals (including Rx): No  >>Shift Summary: Patient observed to be labile, boisterous, and disorganized on unit. Patient able to make needs known. Patient had two episodes of tearfulness on unit. Patient observed to engage appropriately with staff and peers. Patient taking medications as prescribed. This shift, no PRN medication requested or required. No reported or observed side effects to medication. No additional reported or observed agitation, aggression, or other acute emotional distress. No reported or observed physical abnormalities or concerns.  Last Vitals  Vitals Weight: 93.4 kg Temp: (!) 97.5 F (36.4 C) Temp Source: Oral Pulse Rate: 76 Resp: 18 BP: 125/86 Patient Position: (not recorded)  Admission Type  Psych Admission Type (Psych Patients Only) Admission Status: Involuntary Date 72 hour document signed : (not recorded) Time 72 hour document signed : (not recorded) Provider Notified (First and Last Name) (see details for LINK to note): (not recorded)   Psychosocial Assessment  Psychosocial Assessment Patient Complaints: Crying spells, Anxiety, Depression Eye Contact: Fair Facial Expression: Animated Affect: Anxious, Labile Speech: Loud, Pressured, Tangential Interaction: Defensive Motor Activity: Restless Appearance/Hygiene: Unremarkable Behavior Characteristics: Anxious, Cooperative Mood: Preoccupied   Aggressive Behavior  Targets: (not recorded)   Thought Process  Thought Process Coherency: Flight of ideas, Tangential Content: Blaming others,  Preoccupation, Paranoia Delusions: Paranoid Perception: Within Defined Limits Hallucination: None reported or observed Judgment: Poor Confusion: Moderate  Danger to Self/Others  Danger to Self Current suicidal ideation?: Denies Description of Suicide Plan: (not recorded) Self-Injurious Behavior: (not recorded) Agreement Not to Harm Self: (not recorded) Description of Agreement: (not recorded) Danger to Others: None reported or observed

## 2024-09-06 NOTE — Plan of Care (Signed)
   Problem: Education: Goal: Emotional status will improve Outcome: Progressing Goal: Mental status will improve Outcome: Progressing   Problem: Activity: Goal: Interest or engagement in activities will improve Outcome: Progressing Goal: Sleeping patterns will improve Outcome: Progressing   Problem: Safety: Goal: Periods of time without injury will increase Outcome: Progressing

## 2024-09-06 NOTE — Progress Notes (Signed)
(  Sleep Hours) -8.75  (Any PRNs that were needed, meds refused, or side effects to meds)- Vistaril  50 mg   (Any disturbances and when (visitation, over night)- none  (Concerns raised by the patient)- none , pt felt better the Risperdal was moved to night because of daily sedating effect   (SI/HI/AVH)- denies
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
## 2024-09-06 NOTE — Group Note (Signed)
 [facility] LCSW Group Therapy Note   Group Date: 09/06/2024 Start Time: 1010 End Time: 1110   Type of Therapy/Topic:  Group Therapy:  Balance in Holiday Representative and Flexibility   Participation Level:  Active   Description of Group:    This group will address the concept of balance and how it feels and looks when one is unbalanced. Patients will be encouraged to process areas in their lives that are out of balance, and identify reasons for remaining unbalanced. Facilitators will guide patients utilizing problem- solving interventions to address and correct the stressor making their life unbalanced. Understanding and applying boundaries will be explored and addressed for obtaining  and maintaining a balanced life. Patients will be encouraged to explore ways to assertively make their unbalanced needs known to significant others in their lives, using other group members and facilitator for support and feedback.  Therapeutic Goals: Patient will identify two or more emotions or situations they have that consume much of in their lives. Patient will identify signs/triggers that life has become out of balance:  Patient will identify two ways to set boundaries in order to achieve balance in their lives:  Patient will demonstrate ability to communicate their needs through discussion and/or role plays  Summary of Patient Progress:  Patient shared how he is open and flexible to the learning experiences in life.   Therapeutic Modalities:   Cognitive Behavioral Therapy Solution-Focused Therapy Assertiveness Training   [place], LCSWA

## 2024-09-06 NOTE — Plan of Care (Signed)
   Problem: Education: Goal: Emotional status will improve Outcome: Not Progressing Goal: Mental status will improve Outcome: Not Progressing

## 2024-09-07 NOTE — Plan of Care (Signed)
  Problem: Education: Goal: Knowledge of [facility] Education information/materials will improve Outcome: Progressing Goal: Emotional status will improve Outcome: Progressing Goal: Mental status will improve Outcome: Progressing Goal: Verbalization of understanding the information provided will improve Outcome: Progressing   Problem: Activity: Goal: Interest or engagement in activities will improve Outcome: Progressing Goal: Sleeping patterns will improve Outcome: Progressing   Problem: Coping: Goal: Ability to verbalize frustrations and anger appropriately will improve Outcome: Progressing Goal: Ability to demonstrate self-control will improve Outcome: Progressing   Problem: Health Behavior/Discharge Planning: Goal: Identification of resources available to assist in meeting health care needs will improve Outcome: Progressing Goal: Compliance with treatment plan for underlying cause of condition will improve Outcome: Progressing   Problem: Physical Regulation: Goal: Ability to maintain clinical measurements within normal limits will improve Outcome: Progressing   Problem: Safety: Goal: Periods of time without injury will increase Outcome: Progressing   Problem: Education: Goal: Knowledge of [facility] Education information/materials will improve Outcome: Progressing Goal: Emotional status will improve Outcome: Progressing Goal: Mental status will improve Outcome: Progressing Goal: Verbalization of understanding the information provided will improve Outcome: Progressing   Problem: Activity: Goal: Interest or engagement in activities will improve Outcome: Progressing Goal: Sleeping patterns will improve Outcome: Progressing   Problem: Coping: Goal: Ability to verbalize frustrations and anger appropriately will improve Outcome: Progressing Goal: Ability to demonstrate self-control will improve Outcome: Progressing   Problem: Health  Behavior/Discharge Planning: Goal: Identification of resources available to assist in meeting health care needs will improve Outcome: Progressing Goal: Compliance with treatment plan for underlying cause of condition will improve Outcome: Progressing   Problem: Physical Regulation: Goal: Ability to maintain clinical measurements within normal limits will improve Outcome: Progressing   Problem: Safety: Goal: Periods of time without injury will increase Outcome: Progressing   Problem: Activity: Goal: Will identify at least one activity in which they can participate Outcome: Progressing   Problem: Coping: Goal: Ability to identify and develop effective coping behavior will improve Outcome: Progressing Goal: Ability to interact with others will improve Outcome: Progressing Goal: Demonstration of participation in decision-making regarding own care will improve Outcome: Progressing Goal: Ability to use eye contact when communicating with others will improve Outcome: Progressing   Problem: Health Behavior/Discharge Planning: Goal: Identification of resources available to assist in meeting health care needs will improve Outcome: Progressing   Problem: Self-Concept: Goal: Will verbalize positive feelings about self Outcome: Progressing

## 2024-09-07 NOTE — Group Note (Signed)
 Date:  09/07/2024 Time:  8:52 PM  Group Topic/Focus:  Wrap-Up Group:   The focus of this group is to help patients review their daily goal of treatment and discuss progress on daily workbooks.    Participation Level:  Active  Participation Quality:  Appropriate  Affect:  Appropriate  Cognitive:  Appropriate  Insight: Appropriate  Engagement in Group:  Engaged  Modes of Intervention:  Education and Exploration  Additional Comments:  Patient attended and participated in group tonight.  He reports that he like tht he is a caring person.  He wish that he was more close off.  He is too close to people.  Gwenn Chillington Dacosta 09/07/2024, 8:52 PM

## 2024-09-07 NOTE — Group Note (Unsigned)
 Date:  09/07/2024 Time:  7:20 AM  Group Topic/Focus:  Wrap-Up Group:   The focus of this group is to help patients review their daily goal of treatment and discuss progress on daily workbooks.     Participation Level:  {BHH PARTICIPATION OZCZO:77735}  Participation Quality:  {BHH PARTICIPATION QUALITY:22265}  Affect:  {BHH AFFECT:22266}  Cognitive:  {BHH COGNITIVE:22267}  Insight: {BHH Insight2:20797}  Engagement in Group:  {BHH ENGAGEMENT IN HMNLE:77731}  Modes of Intervention:  {BHH MODES OF INTERVENTION:22269}  Additional Comments:  ***  Gwenn Nobie [place] 09/07/2024, 7:20 AM

## 2024-09-07 NOTE — BHH Group Notes (Signed)
 Adult Psychoeducational Group Note  Date:  09/07/2024 Time:  10:47 AM  Group Topic/Focus: Intellectual Wellness   Participation Level:  Did Not Attend  Participation Quality:    Affect:    Cognitive:    Insight:   Engagement in Group:    Modes of Intervention:    Additional Comments:    Annett Berle Hoyer 09/07/2024, 10:47 AM

## 2024-09-07 NOTE — Progress Notes (Signed)
 [facility] MD Progress Note  09/07/2024 8:08 AM Timothy Figueroa  MRN:  [id number]  Principal Problem: Bipolar 1 disorder, severe, current or most recent episode manic, with psychotic features (HCC) Diagnosis: Principal Problem:   Bipolar 1 disorder, severe, current or most recent episode manic, with psychotic features (HCC) Active Problems:   Severe stimulant use disorder (HCC)   History of alcohol abuse  Total Time spent with patient: 30 minutes  Identifying Information and Past Psychiatric History:  The patient is a 28 y.o. male with a psychiatric history most consistent with bipolar 1 disorder, severe stimulant use disorder, and alcohol use disorder currently admitted under IVC due to manic symptoms with psychosis.  Psychiatric history began in childhood with substance use concerns - smoking marijuana and primarily alcohol at that time. Mom reports in the context of being bullied. At age 1 he was already drinking excessively requiring time in rehab. At least 4 times in rehab, primarily for alcohol in younger life.  He has been placed on several medications that he has not taken. On chart review within these last few years he has additionally used stimulants including meth and cocaine in addition to alcohol.  However, patient was also reportedly diagnosed with bipolar 1 disorder before he ever went into the hospital by outpatient psychiatrist - this has been within the last 2 years. Within this past year and worsening within the last 6 months, the patient has had 3 psychiatric admissions ([facility]) for symptoms concerning for mania with psychosis. This includes disorganization in thoughts/behaviors, grandiosity, decreased sleep (staking awake numerous days in a row), elevated/irritable mood, pressured speech, and delusions. He has been placed on several medications including: lexapro, risperidone, cariprazine, abilify, olanzapine , tegretol, depakote, and lamotrigine . No adherence outpatient to any  of these medications per patient, family and chart review. Symptoms are primary elevated with no clear prior depressive episodes known. No history of suicide attempts or self-harming behavior.    Interval events: Patient was compliant with evening risperidone last night. Did attend one group. Noted by staff to labile, boisterous and disorganized at times. Denying SI, HI and AVH. Slept 8.75 hrs overnight  BP (!) 123/91 (BP Location: Right Arm)   Pulse 82   Temp 98 F (36.7 C) (Oral)   Resp 18   Ht 6' (1.829 m)   Wt 93.4 kg   SpO2 99%   BMI 27.94 kg/m    Interview today: Today the patient states that he is tired. Reports it is not fair that this author always talks to him when tired because he is fine and should be able to go home. Difficult to direct off this topic. Does not believe he has a bipolar disorder and asks numerous times what do I need to do to be able to leave? Asks about the IVC numerous times and continues to perseverate on 72 hrs, although this dino has described IVC process in detail. Today does not volunteer delusions about being related to [place] [employer]s, however continues to voice preference for staff to reach out to dad rather than mom. Continues to deny usage of substances and that he had been drugged. Denies all concerns including SI, HI and AVH and asks numerous times to leave the hospital. Timothy Figueroa briefly at the end and interview terminated.    Past Medical History:  Past Medical History:  Diagnosis Date   Allergy    Asthma    History reviewed. No pertinent surgical history. Family History:  Family History  Problem Relation Age  of Onset   Healthy Mother    Hypertension Father    Cancer Paternal Grandfather    Family Psychiatric  History: denies Social History:  Social History   Substance and Sexual Activity  Alcohol Use Yes   Comment: 1/2 vodka past 2 days     Social History   Substance and Sexual Activity  Drug Use Yes   Types:  Marijuana, Amphetamines, Cocaine, Methamphetamines   Comment: daily    Social History   Socioeconomic History   Marital status: Single    Spouse name: Not on file   Number of children: Not on file   Years of education: Not on file   Highest education level: Not on file  Occupational History   Not on file  Tobacco Use   Smoking status: Every Day    Types: Cigarettes   Smokeless tobacco: Current    Types: Chew  Vaping Use   Vaping status: Never Used  Substance and Sexual Activity   Alcohol use: Yes    Comment: 1/2 vodka past 2 days   Drug use: Yes    Types: Marijuana, Amphetamines, Cocaine, Methamphetamines    Comment: daily   Sexual activity: Not on file  Other Topics Concern   Not on file  Social History Narrative   Not on file   Social Drivers of Health   Financial Resource Strain: Not on file  Food Insecurity: No Food Insecurity (09/04/2024)   Hunger Vital Sign    Worried About Running Out of Food in the Last Year: Never true    Ran Out of Food in the Last Year: Never true  Transportation Needs: No Transportation Needs (09/04/2024)   PRAPARE - Administrator, Civil Service (Medical): No    Lack of Transportation (Non-Medical): No  Physical Activity: Not on file  Stress: Not on file  Social Connections: Not on file   Current Medications: Current Facility-Administered Medications  Medication Dose Route Frequency Provider Last Rate Last Admin   haloperidol  (HALDOL ) tablet 5 mg  5 mg Oral TID PRN Onuoha, Chinwendu V, NP       And   diphenhydrAMINE  (BENADRYL ) capsule 50 mg  50 mg Oral TID PRN Onuoha, Chinwendu V, NP       haloperidol  lactate (HALDOL ) injection 5 mg  5 mg Intramuscular TID PRN Onuoha, Chinwendu V, NP       And   diphenhydrAMINE  (BENADRYL ) injection 50 mg  50 mg Intramuscular TID PRN Onuoha, Chinwendu V, NP       And   LORazepam  (ATIVAN ) injection 2 mg  2 mg Intramuscular TID PRN Onuoha, Chinwendu V, NP       haloperidol  lactate  (HALDOL ) injection 10 mg  10 mg Intramuscular TID PRN Onuoha, Chinwendu V, NP       And   diphenhydrAMINE  (BENADRYL ) injection 50 mg  50 mg Intramuscular TID PRN Onuoha, Chinwendu V, NP       And   LORazepam  (ATIVAN ) injection 2 mg  2 mg Intramuscular TID PRN Onuoha, Chinwendu V, NP       hydrOXYzine  (ATARAX ) tablet 50 mg  50 mg Oral TID PRN Ayliana Casciano N, MD   50 mg at 09/06/24 2028   nicotine polacrilex (NICORETTE) gum 2 mg  2 mg Oral PRN Prentis Kitchens A, DO   2 mg at 09/05/24 1829   QUEtiapine (SEROQUEL) tablet 50 mg  50 mg Oral QHS PRN Devanee Pomplun N, MD   50 mg at 09/04/24 2059  risperiDONE (RISPERDAL) tablet 3 mg  3 mg Oral QHS Purvi Ruehl N, MD   3 mg at 09/06/24 2028    Lab Results:  No results found for this or any previous visit (from the past 48 hours).   Blood Alcohol level:  Lab Results  Component Value Date   [facility] <15 07/20/2024   ETH <15 04/14/2024    Metabolic Disorder Labs: Lab Results  Component Value Date   HGBA1C 4.8 09/05/2024   MPG 91.06 09/05/2024   No results found for: PROLACTIN Lab Results  Component Value Date   CHOL 131 04/14/2024   TRIG 109 04/14/2024   HDL 52 04/14/2024   CHOLHDL 2.5 04/14/2024   VLDL 22 04/14/2024   LDLCALC 57 04/14/2024    Physical Findings:  Mental Status exam: Appearance: tall white male, disheveled, seen laying in bed Eye contact: limited, intermittent,  Attitude towards examiner: largely disengaged, perseverative on discharge and 72 hrs  Psychomotor: lays in bed throughout today Speech: not pressured, reduced amount Language: no delays  Mood: tired Affect:  labile, irritable Thought content: denying SI and HI, expressing persecutory delusions that he was poisoned  Thought Process: disorganized, tangential and perseverative  Perception: denying AVH, not overtly RTIS but is preoccupied  Insight: poor - no insight into mental health concerns or reason for admission Judgement: poor based on recent  events    Orientation: to self and place, does not answer date, not oriented to situation Attention/Concentration: limited due to disorganization Memory/Cognition: not formally assessed  Fund of Knowledge: Above average      Musculoskeletal: Strength & Muscle Tone: within normal limits Gait & Station: normal Patient leans: N/A  Physical Exam Constitutional:      Appearance: Normal appearance.  HENT:     Head: Normocephalic and atraumatic.  Pulmonary:     Effort: Pulmonary effort is normal.  Musculoskeletal:        General: Normal range of motion.     Cervical back: Normal range of motion.  Neurological:     Mental Status: He is alert.    ROS Blood pressure (!) 123/91, pulse 82, temperature 98 F (36.7 C), temperature source Oral, resp. rate 18, height 6' (1.829 m), weight 93.4 kg, SpO2 99%. Body mass index is 27.94 kg/m.   Treatment Plan Summary: Daily contact with patient to assess and evaluate symptoms and progress in treatment  Assessment: The patient is a 28 y.o. male with a psychiatric history most consistent with bipolar 1 disorder, current episode manic, with psychotic features, stimulant use disorder, severe, and alcohol use disorder, unspecified. Patient has had recurrent admissions for manic symptoms including elevated/irritable mood, days without sleep (5-6 days of no sleep prior to this admission), grandiosity, increased energy and goal-directed behavior, and with psychotic symptoms including delusions and disorganized behavior. 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 He does have a history of at least 1-2 years of frequent usage of stimulants - both methamphetamines and cocaine. Although patient has denied usage of these substances, collateral indicates frequent use and UDS has been positive for these substances during hospital presentations. Given impact on mental health  concerns and ongoing impairments in social/occupational functioning, stimulant use is considered severe at this time. Has previously carried an alcohol use disorder with prior rehab stays and admissions for detox. Unclear current pattern of usage, and problematic usage of stimulants appears to be driving the picture so will remain as  unspecified for now.    On initial interview, the patient presented with clear delusional content and disorganization in speech and thought process. Poor insight into condition and need for treatment. Per family he had not slept in the past 5-6 days.  Risperidone 1 mg BID was started last evening, but patient has refused the medication thus far. He did take PRN seroquel last night and slept 8.25 hrs (much improved from 3 hrs the night before). As patient continues to decline risperidone, will plan to schedule nighttime seroquel at 50 mg with additional 50 mg available as needed. Will continue to offer risperidone given likely need for LAI prior to discharge. Patient is now sleeping and has mildly cleared (still expressing delusional content but less overt) so hopefully will be able to have a more coherent discussion with him in the coming days.  11/15 the patient remained with poor insight into reason for hospitalization, continuing to voice delusions that he had been poisoned and that his mother was out to get him - putting him in here on purpose. Although continued to report not having bipolar disorder he has been taking the risperidone. Wanted to take in the evening only due to reports of feeling sedated. As he is now taking risperidone will D/C the scheduled quetiapine and offer 50 mg PRN for sleep. Slept >8 hours the past two nights.  Risperidone was consolidated and increased to 3 mg 11/15 evening.   Today the patient did not voice as much overt delusional content as the day prior. Remains disorganized in TP, perseverative on discharge, and labilie in affect with poor  insight into current hospitalization. Will continue current dose of risperidone for another 2 nights before considering increase. Patient did sign ROI for dad, but not for [employer] representative. Family has made it clear patient is not welcome home without first going to inpatient rehab. Currently, the patient is not capable of engaging in logical conversation on this topic, but will pursue in the future.      DSM-5 diagnoses: Bipolar 1 disorder, current episode manic, with psychotic features  Stimulant use disorder, severe, dependance (meth and cocaine) Alcohol use disorder, unspecified      Plan:   Legal Status: -Involuntary - under IVC, continued 09/04/24 @ 12:30 pm   Safety -q15 minute checks  -elopement, suicide and assault precautions  -daily vitals   Psychiatric Concerns  -Continue Risperidone: 3 mg qhS (consider further increase on 11/18)  -PRN quetiapine 50 mg at bedtime for sleep and manic symptoms  -d/c scheduled quetiapine as patient is now taking risperidone  -PRN hydroxyzine  25 mg TID PRN for anxiety -Haldol /ativan /benadryl  agitation protocol    Substance use concerns  -History of alcohol use disorder. Ongoing methamphetamine and cocaine use (patient denies, but collateral reports usage and UDS positive for both)            -will expose to motivational interviewing when patient becomes more cognizant            -NAMI contact in HPI who is agreeable to    Nicotine Replacement  Nicorette gum PRN available    Medical concerns -No chronic medical conditions, will CTM   Additional PRNs: -Tylenol  tablets 650 mg every 6 hours as needed for pain -Maalox/Mylanta suspension 30 mL every 4 hours as needed for indigestion  -Milk of Magnesia 30 mL daily as needed for constipation   Labs -Reviewed as documented in HPI; -CBC, CMP and A1c ordered to be collected -September 2025 lipid panel and TSH  WNL   Psychosocial interventions  -Motivational interviewing when patient is  better able to engage  -daily medication management with psychiatry -Medication education regarding risks/benefits and alternatives -bedside psychotherapy as indicated  -Patient will be encouraged to participate and engage with group therapy  -Appreciate SW assistance in coordinating safe disposition  - parents have organized [employer] patient advocate, JD Doubs: [phone], that could be reached out to if patient becomes agreeable  -Anticipated LOS: 7-10 days    Leita LOISE Arts, MD 09/07/2024, 8:08 AM

## 2024-09-07 NOTE — [facility] Group Notes (Signed)
 Adult Psychoeducational Group Note  Date:  09/07/2024 Time:  9:13 AM  Group Topic/Focus: Orentation Group Orientation:   The focus of this group is to educate the patient on the purpose and policies of crisis stabilization and provide a format to answer questions about their admission.  The group details unit policies and expectations of patients while admitted.  Participation Level:  Did Not Attend  Participation Quality:                         Affect:    Cognitive:    Insight:   Engagement in Group:    Modes of Intervention:    Additional Comments:    Timothy Figueroa 09/07/2024, 9:13 AM

## 2024-09-07 NOTE — Group Note (Unsigned)
 Date:  09/07/2024 Time:  8:00 PM  Group Topic/Focus:  Wrap-Up Group:   The focus of this group is to help patients review their daily goal of treatment and discuss progress on daily workbooks.     Participation Level:  {B[facility] PARTICIPATION OZCZO:77735}  Participation Quality:  {[facility] PARTICIPATION QUALITY:22265}  Affect:  {B[facility] AFFECT:22266}  Cognitive:  {[facility] COGNITIVE:22267}  Insight: {BHH Insight2:20797}  Engagement in Group:  {[facility] ENGAGEMENT IN HMNLE:77731}  Modes of Intervention:  {B[facility] MODES OF INTERVENTION:22269}  Additional Comments:  ***  Aizlyn Schifano Dacosta 09/07/2024, 8:00 PM

## 2024-09-08 MED ORDER — LITHIUM CARBONATE ER 300 MG PO TBCR
300.0000 mg | EXTENDED_RELEASE_TABLET | Freq: Two times a day (BID) | ORAL | Status: DC
  Administered 2024-09-08: 300 mg via ORAL
  Filled 2024-09-08 (×2): qty 1

## 2024-09-08 NOTE — Progress Notes (Signed)
(  Sleep Hours) -8  (Any PRNs that were needed, meds refused, or side effects to meds)- Vistaril  50 mg  (Any disturbances and when (visitation, over night)-none  (Concerns raised by the patient)- pt focused on being in the hospital, pt stated he was ready to leave , writer encouraged pt to talk with the doctor. Ask me 3 discussed with pt and verbal understanding stated.    (SI/HI/AVH)- denies

## 2024-09-08 NOTE — [facility] Group Notes (Signed)
 [facility] Group Notes:  (Nursing/MHT/Case Management/Adjunct)  Date:  09/08/2024  Time:  3:28 PM  Type of Therapy:  Group Therapy  Participation Level:  Did Not Attend    Almarie MALVA Lowers 09/08/2024, 3:28 PM

## 2024-09-08 NOTE — Progress Notes (Signed)
 [facility] MD Progress Note  09/09/2024 4:58 PM Timothy Figueroa  MRN:  [id number] Subjective:   Timothy Figueroa is a 28 yr old male who presented on 11/12 to [facility] under IVC due to Hallucinations and paranoia, and not tending to personal care, he was admitted to [facility] on 11/13.  PPHx is significant for Bipolar Disorder and Polysubstance Abuse (Meth, Cocaine, EtOH), and 3 Prior Psychiatric Hospitalizations (last- [facility] 06/2024).  Case was discussed in the multidisciplinary team. MAR was reviewed and patient was not compliant with his Lithium.  He received PRN Hydroxyzine  yesterday.   Psychiatric Team made the following recommendations yesterday: -Continue Risperdal 3 mg QHS for mood stability and psychosis  -Start Lithium 300 mg BID for mood stability   On interview today patient reports he slept good last night.  He reports his appetite is doing good.  He reports no SI, HI, or AVH.  He reports no Paranoia or Ideas of Reference.  He reports no issues with his medications.  He reports that he will not take the Lithium.  When asked why he reports that he is not agreeable with the potential side effects of the medication and does not want to do the blood work for monitoring.  Discussed that we would stop it and he was thankful.  He reports he has a court date on the 20th.  He reports no other concerns at present.   Principal Problem: Bipolar 1 disorder, severe, current or most recent episode manic, with psychotic features (HCC) Diagnosis: Principal Problem:   Bipolar 1 disorder, severe, current or most recent episode manic, with psychotic features (HCC) Active Problems:   Severe stimulant use disorder (HCC)   History of alcohol abuse  Total Time spent with patient:  I personally spent 35 minutes on the unit in direct patient care. The direct patient care time included face-to-face time with the patient, reviewing the patient's chart, communicating with other professionals, and coordinating care.     Past Psychiatric History:  Bipolar Disorder and Polysubstance Abuse (Meth, Cocaine, EtOH), and 3 Prior Psychiatric Hospitalizations (last- [facility] 06/2024).  Past Medical History:  Past Medical History:  Diagnosis Date   Allergy    Asthma    History reviewed. No pertinent surgical history. Family History:  Family History  Problem Relation Age of Onset   Healthy Mother    Hypertension Father    Cancer Paternal Grandfather    Family Psychiatric  History:  Reports None  Social History:  Social History   Substance and Sexual Activity  Alcohol Use Yes   Comment: 1/2 vodka past 2 days     Social History   Substance and Sexual Activity  Drug Use Yes   Types: Marijuana, Amphetamines, Cocaine, Methamphetamines   Comment: daily    Social History   Socioeconomic History   Marital status: Single    Spouse name: Not on file   Number of children: Not on file   Years of education: Not on file   Highest education level: Not on file  Occupational History   Not on file  Tobacco Use   Smoking status: Every Day    Types: Cigarettes   Smokeless tobacco: Current    Types: Chew  Vaping Use   Vaping status: Never Used  Substance and Sexual Activity   Alcohol use: Yes    Comment: 1/2 vodka past 2 days   Drug use: Yes    Types: Marijuana, Amphetamines, Cocaine, Methamphetamines    Comment: daily  Sexual activity: Not on file  Other Topics Concern   Not on file  Social History Narrative   Not on file   Social Drivers of Health   Financial Resource Strain: Not on file  Food Insecurity: No Food Insecurity (09/04/2024)   Hunger Vital Sign    Worried About Running Out of Food in the Last Year: Never true    Ran Out of Food in the Last Year: Never true  Transportation Needs: No Transportation Needs (09/04/2024)   PRAPARE - Administrator, Civil Service (Medical): No    Lack of Transportation (Non-Medical): No  Physical Activity: Not on file  Stress: Not  on file  Social Connections: Not on file   Additional Social History:                         Sleep: Good Estimated Sleeping Duration (Last 24 Hours): 6.50-8.25 hours (Due to Daylight Saving Time, the durations displayed may not accurately represent documentation during the time change interval)  Appetite:  Good  Current Medications: Current Facility-Administered Medications  Medication Dose Route Frequency Provider Last Rate Last Admin   haloperidol  (HALDOL ) tablet 5 mg  5 mg Oral TID PRN Onuoha, Chinwendu V, NP       And   diphenhydrAMINE  (BENADRYL ) capsule 50 mg  50 mg Oral TID PRN Onuoha, Chinwendu V, NP       haloperidol  lactate (HALDOL ) injection 5 mg  5 mg Intramuscular TID PRN Onuoha, Chinwendu V, NP       And   diphenhydrAMINE  (BENADRYL ) injection 50 mg  50 mg Intramuscular TID PRN Onuoha, Chinwendu V, NP       And   LORazepam  (ATIVAN ) injection 2 mg  2 mg Intramuscular TID PRN Onuoha, Chinwendu V, NP       haloperidol  lactate (HALDOL ) injection 10 mg  10 mg Intramuscular TID PRN Onuoha, Chinwendu V, NP       And   diphenhydrAMINE  (BENADRYL ) injection 50 mg  50 mg Intramuscular TID PRN Onuoha, Chinwendu V, NP       And   LORazepam  (ATIVAN ) injection 2 mg  2 mg Intramuscular TID PRN Onuoha, Chinwendu V, NP       hydrOXYzine  (ATARAX ) tablet 50 mg  50 mg Oral TID PRN Towana Leita SAILOR, MD   50 mg at 09/08/24 2043   nicotine polacrilex (NICORETTE) gum 2 mg  2 mg Oral PRN Prentis Kitchens A, DO   2 mg at 09/05/24 1829   QUEtiapine (SEROQUEL) tablet 50 mg  50 mg Oral QHS PRN Towana Leita SAILOR, MD   50 mg at 09/07/24 2027   risperiDONE (RISPERDAL) tablet 4 mg  4 mg Oral QHS Truc Winfree S, DO        Lab Results: No results found for this or any previous visit (from the past 48 hours).  Blood Alcohol level:  Lab Results  Component Value Date   [facility] <15 07/20/2024   ETH <15 04/14/2024    Metabolic Disorder Labs: Lab Results  Component Value Date   HGBA1C 4.8  09/05/2024   MPG 91.06 09/05/2024   No results found for: PROLACTIN Lab Results  Component Value Date   CHOL 131 04/14/2024   TRIG 109 04/14/2024   HDL 52 04/14/2024   CHOLHDL 2.5 04/14/2024   VLDL 22 04/14/2024   LDLCALC 57 04/14/2024    Physical Findings: AIMS:  ,  ,  ,  ,  ,  ,  CIWA:    COWS:     Musculoskeletal: Strength & Muscle Tone: within normal limits Gait & Station: laying in bed Patient leans: N/A  Psychiatric Specialty Exam:  Presentation  General Appearance:  Casual  Eye Contact: Minimal  Speech: Clear and Coherent  Speech Volume: Normal  Handedness: Right   Mood and Affect  Mood: Irritable  Affect: Congruent; Constricted   Thought Process  Thought Processes: Linear  Descriptions of Associations:Intact  Orientation:Partial  Thought Content:Rumination  History of Schizophrenia/Schizoaffective disorder:No  Duration of Psychotic Symptoms:Less than six months  Hallucinations:Hallucinations: None  Ideas of Reference:None  Suicidal Thoughts:Suicidal Thoughts: No  Homicidal Thoughts:Homicidal Thoughts: No   Sensorium  Memory: Immediate Fair  Judgment: Poor  Insight: Poor   Executive Functions  Concentration: Fair  Attention Span: Fair  Recall: Fair  Fund of Knowledge: Fair  Language: Fair   Psychomotor Activity  Psychomotor Activity:Psychomotor Activity: Normal   Assets  Assets: Resilience   Sleep  Sleep:Sleep: Good    Physical Exam: Physical Exam Vitals and nursing note reviewed.  Constitutional:      General: He is not in acute distress.    Appearance: Normal appearance. He is normal weight. He is not ill-appearing or toxic-appearing.  HENT:     Head: Normocephalic and atraumatic.  Pulmonary:     Effort: Pulmonary effort is normal.  Neurological:     Mental Status: He is alert.    Review of Systems  Respiratory:  Negative for cough and shortness of breath.   Cardiovascular:   Negative for chest pain.  Gastrointestinal:  Negative for abdominal pain, constipation, diarrhea, nausea and vomiting.  Neurological:  Negative for dizziness, weakness and headaches.  Psychiatric/Behavioral:  Negative for depression, hallucinations and suicidal ideas. The patient is not nervous/anxious.    Blood pressure (!) 95/47, pulse 74, temperature 98.2 F (36.8 C), temperature source Oral, resp. rate 18, height 6' (1.829 m), weight 93.4 kg, SpO2 100%. Body mass index is 27.94 kg/m.   Treatment Plan Summary: Daily contact with patient to assess and evaluate symptoms and progress in treatment and Medication management  Timothy Figueroa is a 28 yr old male who presented on 11/12 to [facility] under IVC due to Hallucinations and paranoia, and not tending to personal care, he was admitted to [facility] on 11/13.  PPHx is significant for Bipolar Disorder and Polysubstance Abuse (Meth, Cocaine, EtOH), and 3 Prior Psychiatric Hospitalizations (last- [facility] 06/2024).   Timothy Figueroa declined to take the Lithium and reports he will not take it and wants to stop it.  Due to this we will discontinue the Lithium and will further increase his Risperdal.  He is more linear and organized but does seem to still have paranoia though he denies it.  We will further increase his Risperdal since the Lithium is being stopped.  We will pursue an LAI given his history of non compliance.  We will continue to monitor.   Bipolar Disorder, Current Episode Manic, w/Psychotic Features: -Increase Risperdal to 4 mg QHS for mood stability and psychosis  -Stop Lithium  -Continue Seroquel 50 mg QHS PRN insomnia and mania -Continue Agitation Protocol: Haldol /Ativan /Benadryl    Stimulant use disorder, severe, dependance (meth and cocaine)  EtOH Use Disorder, unspecified:  -Continue to discuss Residential Rehab   Nicotine Dependence: -Continue Nicotine Gum 2 mg PRN   -Continue PRN's: Atarax     Timothy GORMAN Rosser,  DO 09/09/2024, 4:58 PM

## 2024-09-08 NOTE — Group Note (Addendum)
 LCSW Group Therapy Note   Group Date: 09/08/2024 Start Time: 1100 End Time: 1200   Participation:  patient was present and actively participated in the discussion.  At times, patient was disruptive when laughing, and experienced delusions.    Type of Therapy:  Group Therapy  Topic:  Shining from Within: Confidence and Self-Love Journey  Objective:  To support participants in developing confidence and self-love through self-awareness, self-compassion, and practical skills that nurture personal growth.   Group Goals Encourage self-reflection and self-acceptance by identifying personal strengths and achievements. Teach skills to challenge negative self-talk and replace it with supportive, truthful self-talk. Foster resilience and self-worth through owens & minor, gratitude, and self-care practices.   Summary:  This group explores the connection between confidence and self-love by guiding participants through reflection, mindset shifts, and practical tools like affirmations, strength recognition, and goal-setting. Activities are designed to promote self-compassion, build emotional resilience, and normalize the slow, patient journey of inner growth.   Therapeutic Modalities Used Cognitive Behavioral Therapy (CBT): Challenging and reframing unhelpful self-talk. Motivational Interviewing (MI): Encouraging small, achievable goals. Elements of Dialectical Behavioral Therapist (DBT):  Mindfulness and Self-Compassion: Promoting present-moment awareness and kindness toward self.   Deklyn Trachtenberg O Mithra Spano, LCSWA 09/08/2024  5:30 PM

## 2024-09-08 NOTE — Group Note (Signed)
 Recreation Therapy Group Note   Group Topic:Coping Skills  Group Date: 09/08/2024 Start Time: 1035 End Time: 1100 Facilitators: Timothy Figueroa, LRT,CTRS Location: [address] Dayroom   Group Topic: Coping Skills   Goal Area(s) Addresses: Patient will define what a coping skill is. Patient will create a list of healthy coping skills beginning with each letter of the alphabet. Patient will successfully identify positive coping skills they can use post d/c.   Behavioral Response:    Intervention: Worksheet   Activity: Coping A to Z. Patient asked to identify what a coping skill is and when they use them. Patients with clinical research associate discussed healthy versus unhealthy coping skills. Next patients were given a blank worksheet titled Coping Skills A-Z. Patients were instructed to come up with at least one positive coping skill per letter of the alphabet. Patients were given 15 minutes to brainstorm before ideas were presented to the large group. Patients and LRT debriefed on the importance of coping skill selection based on situation and back-up plans when a skill tried is not effective. At the end of group, patients were given an handout of alphabetized strategies to keep for future reference.   Education: Pharmacologist, Scientist, Physiological, Discharge Planning.    Education Outcome: Acknowledges education/Verbalizes understanding/In group clarification offered/Additional education needed    Clinical Observations/Individualized Feedback: LRT didn't conduct group due to assisting with dayroom duties.    Plan: Continue to engage patient in RT group sessions 2-3x/week.   Timothy Figueroa, LRT,CTRS  09/08/2024 1:37 PM

## 2024-09-08 NOTE — Progress Notes (Signed)

## 2024-09-08 NOTE — Plan of Care (Signed)
  Problem: Education: Goal: Knowledge of [facility] Education information/materials will improve Outcome: Progressing Goal: Emotional status will improve Outcome: Progressing Goal: Mental status will improve Outcome: Progressing Goal: Verbalization of understanding the information provided will improve Outcome: Progressing   Problem: Activity: Goal: Interest or engagement in activities will improve Outcome: Progressing Goal: Sleeping patterns will improve Outcome: Progressing   Problem: Coping: Goal: Ability to verbalize frustrations and anger appropriately will improve Outcome: Progressing Goal: Ability to demonstrate self-control will improve Outcome: Progressing   Problem: Health Behavior/Discharge Planning: Goal: Identification of resources available to assist in meeting health care needs will improve Outcome: Progressing Goal: Compliance with treatment plan for underlying cause of condition will improve Outcome: Progressing   Problem: Physical Regulation: Goal: Ability to maintain clinical measurements within normal limits will improve Outcome: Progressing   Problem: Safety: Goal: Periods of time without injury will increase Outcome: Progressing   Problem: Education: Goal: Knowledge of [facility] Education information/materials will improve Outcome: Progressing Goal: Emotional status will improve Outcome: Progressing Goal: Mental status will improve Outcome: Progressing Goal: Verbalization of understanding the information provided will improve Outcome: Progressing   Problem: Activity: Goal: Interest or engagement in activities will improve Outcome: Progressing Goal: Sleeping patterns will improve Outcome: Progressing   Problem: Coping: Goal: Ability to verbalize frustrations and anger appropriately will improve Outcome: Progressing Goal: Ability to demonstrate self-control will improve Outcome: Progressing   Problem: Health  Behavior/Discharge Planning: Goal: Identification of resources available to assist in meeting health care needs will improve Outcome: Progressing Goal: Compliance with treatment plan for underlying cause of condition will improve Outcome: Progressing   Problem: Physical Regulation: Goal: Ability to maintain clinical measurements within normal limits will improve Outcome: Progressing   Problem: Safety: Goal: Periods of time without injury will increase Outcome: Progressing   Problem: Activity: Goal: Will identify at least one activity in which they can participate Outcome: Progressing   Problem: Coping: Goal: Ability to identify and develop effective coping behavior will improve Outcome: Progressing Goal: Ability to interact with others will improve Outcome: Progressing Goal: Demonstration of participation in decision-making regarding own care will improve Outcome: Progressing Goal: Ability to use eye contact when communicating with others will improve Outcome: Progressing   Problem: Health Behavior/Discharge Planning: Goal: Identification of resources available to assist in meeting health care needs will improve Outcome: Progressing   Problem: Self-Concept: Goal: Will verbalize positive feelings about self Outcome: Progressing

## 2024-09-08 NOTE — [facility] Group Notes (Signed)
 Adult Psychoeducational Group Note  Date:  09/08/2024 Time:  1:48 PM  Group Topic/Focus: Socialwork Group Dimensions of Wellness:   The focus of this group is to introduce the topic of wellness and discuss the role each dimension of wellness plays in total health.  Participation Level:  Active  Participation Quality:  Appropriate  Affect:  Appropriate  Cognitive:  Appropriate  Insight: Appropriate  Engagement in Group:  Engaged  Modes of Intervention:  Discussion  Additional Comments:  The patient engaged in group discussion.  Timothy Figueroa 09/08/2024, 1:48 PM

## 2024-09-08 NOTE — Plan of Care (Signed)
   Problem: Education: Goal: Emotional status will improve Outcome: Progressing Goal: Mental status will improve Outcome: Progressing   Problem: Activity: Goal: Interest or engagement in activities will improve Outcome: Progressing Goal: Sleeping patterns will improve Outcome: Progressing   Problem: Safety: Goal: Periods of time without injury will increase Outcome: Progressing

## 2024-09-08 NOTE — [facility] Group Notes (Signed)
 [facility] Group Notes:  (Nursing/MHT/Case Management/Adjunct)  Date:  09/08/2024  Time:  11:09 PM  Type of Therapy:  Wrap-up group  Participation Level:  Active  Participation Quality:  Appropriate  Affect:  Appropriate  Cognitive:  Appropriate  Insight:  Appropriate  Engagement in Group:  Engaged  Modes of Intervention:  Education  Summary of Progress/Problems: Goal to be D/C. Rated day 10/10.  Timothy Figueroa [place] 09/08/2024, 11:09 PM

## 2024-09-08 NOTE — Progress Notes (Signed)
(  Sleep Hours) - 9.75 (Any PRNs that were needed, meds refused, or side effects to meds)- seroquel, atarax  (Any disturbances and when (visitation, over night) (Concerns raised by the patient)- none (SI/HI/AVH)-denies

## 2024-09-08 NOTE — Progress Notes (Addendum)
 [facility] MD Progress Note  09/08/2024 8:06 AM Timothy Figueroa  MRN:  [id number]  Principal Problem: Bipolar 1 disorder, severe, current or most recent episode manic, with psychotic features (HCC) Diagnosis: Principal Problem:   Bipolar 1 disorder, severe, current or most recent episode manic, with psychotic features (HCC) Active Problems:   Severe stimulant use disorder (HCC)   History of alcohol abuse  Total Time spent with patient: 30 minutes  Identifying Information and Past Psychiatric History:  The patient is a 28 y.o. male with a psychiatric history most consistent with bipolar 1 disorder, severe stimulant use disorder, and alcohol use disorder currently admitted under IVC due to manic symptoms with psychosis.  Psychiatric history began in childhood with substance use concerns - smoking marijuana and primarily alcohol at that time. Mom reports in the context of being bullied. At age 62 he was already drinking excessively requiring time in rehab. At least 4 times in rehab, primarily for alcohol in younger life.  He has been placed on several medications that he has not taken. On chart review within these last few years he has additionally used stimulants including meth and cocaine in addition to alcohol.  However, patient was also reportedly diagnosed with bipolar 1 disorder before he ever went into the hospital by outpatient psychiatrist - this has been within the last 2 years. Within this past year and worsening within the last 6 months, the patient has had 3 psychiatric admissions ([facility]) for symptoms concerning for mania with psychosis. This includes disorganization in thoughts/behaviors, grandiosity, decreased sleep (staking awake numerous days in a row), elevated/irritable mood, pressured speech, and delusions. He has been placed on several medications including: lexapro, risperidone, cariprazine, abilify, olanzapine , tegretol, depakote, and lamotrigine . No adherence outpatient to any  of these medications per patient, family and chart review. Symptoms are primary elevated with no clear prior depressive episodes known. No history of suicide attempts or self-harming behavior.    Interval events: Patient was compliant with scheduled risperidone last evening. Patient has largely been isolative to room, not attending groups or present on the milieu. On checks denying concerns.  BP 122/81 (BP Location: Left Arm)   Pulse 81   Temp 97.8 F (36.6 C) (Oral)   Resp 18   Ht 6' (1.829 m)   Wt 93.4 kg   SpO2 98%   BMI 27.94 kg/m    Interview today: Today the patient states that he is fine. Again begins interview by asking what his diagnosis is and how he can leave the hospital. Continues to deny any actual need to be here. When this author specifically pressed about delusions from first day by asking do you think your mom is not your mom? The patient answered no comment. He is able to give a date of his upcoming court - 11/20 - as well as some information about his charges. Continues to deny any substance use concerns and need for rehab. No SI, HI or AVH reported.   Collateral: Later, during progression meeting, staff noted that the patient in the afternoon was up out of bed, labile, interrupting group due to emotional outbursts and continuing to express delusional content regarding being related to [place] and [place] royalty.   Past Medical History:  Past Medical History:  Diagnosis Date   Allergy    Asthma    History reviewed. No pertinent surgical history. Family History:  Family History  Problem Relation Age of Onset   Healthy Mother    Hypertension Father  Cancer Paternal Grandfather    Family Psychiatric  History: denies Social History:  Social History   Substance and Sexual Activity  Alcohol Use Yes   Comment: 1/2 vodka past 2 days     Social History   Substance and Sexual Activity  Drug Use Yes   Types: Marijuana, Amphetamines, Cocaine,  Methamphetamines   Comment: daily    Social History   Socioeconomic History   Marital status: Single    Spouse name: Not on file   Number of children: Not on file   Years of education: Not on file   Highest education level: Not on file  Occupational History   Not on file  Tobacco Use   Smoking status: Every Day    Types: Cigarettes   Smokeless tobacco: Current    Types: Chew  Vaping Use   Vaping status: Never Used  Substance and Sexual Activity   Alcohol use: Yes    Comment: 1/2 vodka past 2 days   Drug use: Yes    Types: Marijuana, Amphetamines, Cocaine, Methamphetamines    Comment: daily   Sexual activity: Not on file  Other Topics Concern   Not on file  Social History Narrative   Not on file   Social Drivers of Health   Financial Resource Strain: Not on file  Food Insecurity: No Food Insecurity (09/04/2024)   Hunger Vital Sign    Worried About Running Out of Food in the Last Year: Never true    Ran Out of Food in the Last Year: Never true  Transportation Needs: No Transportation Needs (09/04/2024)   PRAPARE - Administrator, Civil Service (Medical): No    Lack of Transportation (Non-Medical): No  Physical Activity: Not on file  Stress: Not on file  Social Connections: Not on file   Current Medications: Current Facility-Administered Medications  Medication Dose Route Frequency Provider Last Rate Last Admin   haloperidol  (HALDOL ) tablet 5 mg  5 mg Oral TID PRN Onuoha, Chinwendu V, NP       And   diphenhydrAMINE  (BENADRYL ) capsule 50 mg  50 mg Oral TID PRN Onuoha, Chinwendu V, NP       haloperidol  lactate (HALDOL ) injection 5 mg  5 mg Intramuscular TID PRN Onuoha, Chinwendu V, NP       And   diphenhydrAMINE  (BENADRYL ) injection 50 mg  50 mg Intramuscular TID PRN Onuoha, Chinwendu V, NP       And   LORazepam  (ATIVAN ) injection 2 mg  2 mg Intramuscular TID PRN Onuoha, Chinwendu V, NP       haloperidol  lactate (HALDOL ) injection 10 mg  10 mg  Intramuscular TID PRN Onuoha, Chinwendu V, NP       And   diphenhydrAMINE  (BENADRYL ) injection 50 mg  50 mg Intramuscular TID PRN Onuoha, Chinwendu V, NP       And   LORazepam  (ATIVAN ) injection 2 mg  2 mg Intramuscular TID PRN Onuoha, Chinwendu V, NP       hydrOXYzine  (ATARAX ) tablet 50 mg  50 mg Oral TID PRN Zayyan Mullen N, MD   50 mg at 09/07/24 2027   nicotine polacrilex (NICORETTE) gum 2 mg  2 mg Oral PRN Prentis Kitchens A, DO   2 mg at 09/05/24 1829   QUEtiapine (SEROQUEL) tablet 50 mg  50 mg Oral QHS PRN Phillip Sandler N, MD   50 mg at 09/07/24 2027   risperiDONE (RISPERDAL) tablet 3 mg  3 mg Oral QHS Towana Leita SAILOR,  MD   3 mg at 09/07/24 2027    Lab Results:  No results found for this or any previous visit (from the past 48 hours).   Blood Alcohol level:  Lab Results  Component Value Date   [facility] <15 07/20/2024   ETH <15 04/14/2024    Metabolic Disorder Labs: Lab Results  Component Value Date   HGBA1C 4.8 09/05/2024   MPG 91.06 09/05/2024   No results found for: PROLACTIN Lab Results  Component Value Date   CHOL 131 04/14/2024   TRIG 109 04/14/2024   HDL 52 04/14/2024   CHOLHDL 2.5 04/14/2024   VLDL 22 04/14/2024   LDLCALC 57 04/14/2024    Physical Findings:  Mental Status exam: Appearance: tall white male, disheveled, seen laying in bed Eye contact: limited, intermittent,  Attitude towards examiner: largely disengaged, perseverative on discharge  Psychomotor: lays in bed throughout today Speech: not pressured, reduced amount Language: no delays  Mood: tired Affect:  labile, irritable Thought content: denying SI and HI. Is keeping delusional content to self today no comment in stead of voicing concerns about mom not being his mom or being related to [place] royalty Thought Process: disorganized, tangential and perseverative  Perception: denying AVH, not overtly RTIS but is preoccupied  Insight: poor - no insight into mental health concerns or reason  for admission Judgement: poor based on recent events    Orientation: to self and place, does not answer date, not oriented to situation Attention/Concentration: limited due to disorganization Memory/Cognition: not formally assessed  Fund of Knowledge: Above average      Musculoskeletal: Strength & Muscle Tone: within normal limits Gait & Station: normal Patient leans: N/A  Physical Exam Constitutional:      Appearance: Normal appearance.  HENT:     Head: Normocephalic and atraumatic.  Pulmonary:     Effort: Pulmonary effort is normal.  Musculoskeletal:        General: Normal range of motion.     Cervical back: Normal range of motion.  Neurological:     Mental Status: He is alert.    ROS Blood pressure 122/81, pulse 81, temperature 97.8 F (36.6 C), temperature source Oral, resp. rate 18, height 6' (1.829 m), weight 93.4 kg, SpO2 98%. Body mass index is 27.94 kg/m.   Treatment Plan Summary: Daily contact with patient to assess and evaluate symptoms and progress in treatment  Assessment: The patient is a 28 y.o. male with a psychiatric history most consistent with bipolar 1 disorder, current episode manic, with psychotic features, stimulant use disorder, severe, and alcohol use disorder, unspecified. Patient has had recurrent admissions for manic symptoms including elevated/irritable mood, days without sleep (5-6 days of no sleep prior to this admission), grandiosity, increased energy and goal-directed behavior, and with psychotic symptoms including delusions and disorganized behavior. 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 He does have a history of at least 1-2 years of frequent usage of stimulants - both methamphetamines and cocaine. Although patient has denied usage of these substances, collateral indicates frequent use and UDS has been positive for these substances during hospital  presentations. Given impact on mental health concerns and ongoing impairments in social/occupational functioning, stimulant use is considered severe at this time. Has previously carried an alcohol use disorder with prior rehab stays and admissions for detox. Unclear current pattern of usage, and problematic usage of stimulants appears to be driving the picture so will remain  as unspecified for now.    On initial interview, the patient presented with clear delusional content and disorganization in speech and thought process. Poor insight into condition and need for treatment. Per family he had not slept in the past 5-6 days.  Risperidone 1 mg BID was started and consolidated and increased to 3 mg 11/15 evening due to patient reports of daytime sedation. Initially refusing medicines but has been compliant over the last 3 days.  Yesterday the patient did not voice as much overt delusional content as the day prior and today he chose to respond no comment instead of expressing underlying delusions this morning. Appears to be responding to risperidone, however remained disorganized in TP, perseverative on discharge, and labilie in affect with poor insight into current hospitalization. Per staff, he has continued to present as labile in affect, disruptive, and expressing delusional content. Will continue current dose of risperidone for another night before considering increase tomorrow. However, given ongoing manic symptoms with psychosis will add lithium 300 mg BID to begin tonight.  Patient did sign ROI for dad, but not for [facility] representative. Declines anyone speak with mom, however mom is the one who petitioned for IVC. Family has made it clear patient is not welcome home without first going to inpatient rehab. Currently, the patient is not capable of engaging in logical conversation on this topic, but will pursue in the future.      DSM-5 diagnoses: Bipolar 1 disorder, current episode manic, with psychotic  features  Stimulant use disorder, severe, dependance (meth and cocaine) Alcohol use disorder, unspecified      Plan:   Legal Status: -Involuntary - under IVC, continued 09/04/24 @ 12:30 pm; submitted for 10 days   Safety -q15 minute checks  -elopement, suicide and assault precautions  -daily vitals   Psychiatric Concerns  -Continue Risperidone: 3 mg qhS (consider further increase on 11/18)  -Begin lithium (lithobid) 300 mg BID starting this evening  -PRN quetiapine 50 mg at bedtime for sleep and manic symptoms  -d/c scheduled quetiapine as patient is now taking risperidone  -PRN hydroxyzine  25 mg TID PRN for anxiety -Haldol /ativan /benadryl  agitation protocol    Substance use concerns  -History of alcohol use disorder. Ongoing methamphetamine and cocaine use (patient denies, but collateral reports usage and UDS positive for both)            -will expose to motivational interviewing when patient becomes more cognizant            -NAMI contact in HPI who is agreeable to    Nicotine Replacement  Nicorette gum PRN available    Medical concerns -No chronic medical conditions, will CTM   Additional PRNs: -Tylenol  tablets 650 mg every 6 hours as needed for pain -Maalox/Mylanta suspension 30 mL every 4 hours as needed for indigestion  -Milk of Magnesia 30 mL daily as needed for constipation   Labs -Reviewed as documented in HPI; -CBC, CMP and A1c ordered to be collected -September 2025 lipid panel and TSH WNL   Psychosocial interventions  -Motivational interviewing when patient is better able to engage  -daily medication management with psychiatry -Medication education regarding risks/benefits and alternatives -bedside psychotherapy as indicated  -Patient will be encouraged to participate and engage with group therapy  -Appreciate SW assistance in coordinating safe disposition  - parents have organized [facility] patient advocate, JD Doubs: [phone], that could be reached out  to if patient becomes agreeable  -Anticipated LOS: 7-10 days    Leita LOISE Arts, MD  09/08/2024, 8:06 AM

## 2024-09-08 NOTE — Progress Notes (Signed)
 Collateral contact - JD [place], [phone] [facility]I  JD was hired by patient's family to look for a placement for patient.  Patient signed the ROI and accepted the service.  JD said that he has about 10 possible dual-diagnosis placements in a variety of places ([place] , [place] ), but wasn't sure how profound patient's symptoms were.  He said that he spoke with patient today, and he still exhibits disorganized thought process and paranoia.  JD wasn't sure if a possible placement should be heavier on stabilization and his mental health symptoms will continue, or his symptoms are drug induced and will significantly decrease by the time he is discharged.  He said he needs this information to determine the appropriate level of care.   Felise Georgia, LCSWA 09/08/2024

## 2024-09-08 NOTE — Progress Notes (Signed)
   09/08/24 1700  Psych Admission Type (Psych Patients Only)  Admission Status Involuntary  Psychosocial Assessment  Patient Complaints Anxiety  Eye Contact Fair  Facial Expression Animated  Affect Anxious  Speech Logical/coherent  Interaction Assertive  Motor Activity Other (Comment) (wnl)  Appearance/Hygiene Disheveled  Behavior Characteristics Cooperative  Mood Preoccupied  Thought Process  Coherency Circumstantial  Content Other (Comment)  Delusions WDL  Perception WDL  Hallucination None reported or observed  Judgment Limited  Confusion None  Danger to Self  Current suicidal ideation? Denies  Danger to Others  Danger to Others None reported or observed

## 2024-09-08 NOTE — [facility] Group Notes (Signed)
 Adult Psychoeducational Group Note  Date:  09/08/2024 Time:  1:26 PM  Group Topic/Focus:Recreation Therapy   Self Care:   The focus of this group is to help patients understand the importance of self-care in order to improve or restore emotional, physical, spiritual, interpersonal, and financial health.  Participation Level:  Did Not Attend  Participation Quality:    Affect:    Cognitive:    Insight:   Engagement in Group:    Modes of Intervention:   Additional Comments:    Cheyeanne Roadcap Lee 09/08/2024, 1:26 PM

## 2024-09-09 MED ORDER — RISPERIDONE 2 MG PO TABS
4.0000 mg | ORAL_TABLET | Freq: Every day | ORAL | Status: DC
  Administered 2024-09-09 – 2024-09-10 (×2): 4 mg via ORAL
  Filled 2024-09-09 (×2): qty 2

## 2024-09-09 NOTE — Plan of Care (Signed)
   Problem: Education: Goal: Knowledge of [facility] Education information/materials will improve Outcome: Progressing Goal: Emotional status will improve Outcome: Progressing Goal: Mental status will improve Outcome: Progressing Goal: Verbalization of understanding the information provided will improve Outcome: Progressing

## 2024-09-09 NOTE — Group Note (Deleted)
 Date:  09/09/2024 Time:  5:10 PM

## 2024-09-09 NOTE — [facility] Group Notes (Signed)
 Adult Psychoeducational Group Note  Date:  09/09/2024 Time:  1:29 PM  Group Topic/Focus: Pharmacy Group Diagnosis Education:   The focus of this group is to discuss the major disorders that patients maybe diagnosed with.  Group discusses the importance of knowing what one's diagnosis is so that one can understand treatment and better advocate for oneself.  Participation Level:  Active  Participation Quality:  Appropriate  Affect:  Appropriate  Cognitive:  Appropriate  Insight: Appropriate  Engagement in Group:  Engaged  Modes of Intervention:  Discussion  Additional Comments: The patient engaged in group discussion.  Nicola Quesnell Lee 09/09/2024, 1:29 PM

## 2024-09-09 NOTE — [facility] Group Notes (Signed)
 Adult Psychoeducational Group Note  Date:  09/09/2024 Time:  11:02 AM  Group Topic/FocusOrentation Group:  Orientation:   The focus of this group is to educate the patient on the purpose and policies of crisis stabilization and provide a format to answer questions about their admission.  The group details unit policies and expectations of patients while admitted.  Participation Level:  Did Not Attend  Participation Quality:    Affect:    Cognitive:   Insight:   Engagement in Group:    Modes of Intervention:    Additional Comments:    Mirl Hillery Lee 09/09/2024, 11:02 AM

## 2024-09-09 NOTE — Group Note (Signed)
 Recreation Therapy Group Note   Group Topic:Leisure Education  Group Date: 09/09/2024 Start Time: 1045 End Time: 1115 Facilitators: Herron Fero-McCall, LRT,CTRS Location: [address] Dayroom   Group Topic: Leisure Education  Goal Area(s) Addresses:  Patient will identify positive leisure activities for use post discharge. Patient will identify at least one positive benefit of participation in leisure activities.  Patient will work effectively with peers to reach end goal of activity.   Behavioral Response:    Intervention: Cooperative Play   Activity: In a circle, patients were seated and had to hit a beach ball to each other as if playing volleyball. LRT would time the group to see how long they could keep the ball moving without it coming to a complete stop. If the ball came to a stop, LRT would start the time over. Patients were to remain seated and could only get up if the ball went out of the circle.  Education:  Leisure Scientist, Physiological, Special Educational Needs Teacher, Teamwork, Discharge Planning  Education Outcome: Acknowledges education/In group clarification offered/Needs additional education.    Affect/Mood: N/A   Participation Level: Did not attend    Clinical Observations/Individualized Feedback:      Plan: Continue to engage patient in RT group sessions 2-3x/week.   Nichols Corter-McCall, LRT,CTRS 09/09/2024 1:23 PM

## 2024-09-09 NOTE — Group Note (Signed)
 Date:  09/09/2024 Time:  8:46 PM  Group Topic/Focus:  Wrap-Up Group:   The focus of this group is to help patients review their daily goal of treatment and discuss progress on daily workbooks.    Participation Level:  Active  Participation Quality:  Appropriate  Affect:  Appropriate  Cognitive:  Appropriate  Insight: Appropriate  Engagement in Group:  Engaged  Modes of Intervention:  Education and Exploration  Additional Comments:  Patient attended and participated in group tonight. She repots that today he learnt that no one tells the truth  Timothy Figueroa 09/09/2024, 8:46 PM

## 2024-09-09 NOTE — [facility] Group Notes (Signed)
 Adult Psychoeducational Group Note  Date:  09/09/2024 Time:  11:17 AM  Group Topic/Focus: REcreation Therapy Self Care:   The focus of this group is to help patients understand the importance of self-care in order to improve or restore emotional, physical, spiritual, interpersonal, and financial health.  Participation Level:  Did Not Attend  Participation Quality:    Affect:    Cognitive:    Insight:   Engagement in Group:    Modes of Intervention:    Additional Comments:    Timothy Figueroa 09/09/2024, 11:17 AM

## 2024-09-09 NOTE — Progress Notes (Signed)
   09/09/24 0900  Psych Admission Type (Psych Patients Only)  Admission Status Involuntary  Psychosocial Assessment  Patient Complaints Anxiety  Eye Contact Fair  Facial Expression Animated  Affect Anxious;Preoccupied  Speech Rapid  Interaction Assertive  Motor Activity Slow  Appearance/Hygiene Disheveled  Behavior Characteristics Anxious  Mood Preoccupied  Thought Process  Coherency Circumstantial  Content Preoccupation  Delusions WDL  Perception WDL  Hallucination None reported or observed  Judgment Impaired  Confusion None  Danger to Self  Current suicidal ideation? Denies  Danger to Others  Danger to Others None reported or observed

## 2024-09-09 NOTE — Plan of Care (Signed)
  Problem: Education: Goal: Emotional status will improve Outcome: Progressing Goal: Verbalization of understanding the information provided will improve Outcome: Progressing   Problem: Activity: Goal: Interest or engagement in activities will improve Outcome: Progressing   Problem: Coping: Goal: Ability to verbalize frustrations and anger appropriately will improve Outcome: Progressing   Problem: Health Behavior/Discharge Planning: Goal: Identification of resources available to assist in meeting health care needs will improve Outcome: Progressing

## 2024-09-09 NOTE — Progress Notes (Signed)
 Patient refused morning dose of lithium stating I don't take that Patient informed that he took it last night and patient states the risperidone and Seroquel are working fine. Patient continues to report:  it has been validated that someone was directly targeting me at [facility], I mean, I'm not paranoid about it but people were out to get me. This clinical research associate reviewed importance of medication adherence with patient.

## 2024-09-10 ENCOUNTER — Encounter (HOSPITAL_COMMUNITY): Payer: Self-pay

## 2024-09-10 NOTE — [facility] Group Notes (Signed)
 Adult Psychoeducational Group Note  Date:  09/10/2024 Time:  8:51 PM  Group Topic/Focus:  Wrap-Up Group:   The focus of this group is to help patients review their daily goal of treatment and discuss progress on daily workbooks.  Participation Level:  Active  Participation Quality:  Appropriate and Monopolizing  Affect:  Appropriate and Excited  Cognitive:  Appropriate  Insight: Appropriate  Engagement in Group:  Engaged  Modes of Intervention:  Discussion  Additional Comments:  Pt told that today was a good day on the unit, the highlight of which was preparing for his upcoming transfer to a residential rehab. Pt rated his day an 8 out of 10. Shandy required frequent redirection from the Writer to keep from interrupting his peers with hyper-verbal ranting.  Karrin Eisenmenger Lee 09/10/2024, 8:51 PM

## 2024-09-10 NOTE — Progress Notes (Addendum)
(  Sleep Hours) - 8.25 (Any PRNs that were needed, meds refused, or side effects to meds)- Vistaril  50 mg (Any disturbances and when (visitation, over night)- none reported (Concerns raised by the patient)- Doesn't want to start on Lithium (SI/HI/AVH)- Denies all Pt observed in milieu interactive with patients.  Patient calm and cooperative during assessment.  Pt has paranoid thoughts.  States that he's being targeted by patent examiner.  States that no one believes him

## 2024-09-10 NOTE — Progress Notes (Signed)
 Patient stated they slept Fair last night. Patient denies SI, AVH. Scored 1/10 on anxiety and zero on feeling of hopelessness and depression. Patient goal is Leaving and states will Ask questions to help reach their goal. Patient has been cooperative.      09/10/24 1000  Psych Admission Type (Psych Patients Only)  Admission Status Involuntary  Psychosocial Assessment  Patient Complaints Anxiety  Eye Contact Fair  Facial Expression Animated  Affect Preoccupied  Speech Loud  Interaction Assertive  Motor Activity Other (Comment) (WDL)  Appearance/Hygiene Unremarkable  Behavior Characteristics Cooperative;Anxious  Mood Preoccupied  Thought Process  Coherency Circumstantial  Content Preoccupation  Delusions Paranoid  Perception WDL  Hallucination None reported or observed  Judgment Limited  Confusion None  Danger to Self  Current suicidal ideation? Denies  Danger to Others  Danger to Others None reported or observed

## 2024-09-10 NOTE — [facility] Group Notes (Signed)
 Adult Psychoeducational Group Note  Date:  09/10/2024 Time:  11:29 AM  Group Topic/Focus: Recreational Therapy  Participation Level:  Active  Participation Quality:  Appropriate  Affect:  Appropriate  Cognitive:  Appropriate  Insight: Appropriate  Engagement in Group:  Engaged  Modes of Intervention:  Discussion  Additional Comments:  Pt attended the rec therapy group and remained appropriate and engaged throughout the duration of the group.   Jamelle Dolphin O 09/10/2024, 11:29 AM

## 2024-09-10 NOTE — BH IP Treatment Plan (Signed)
 Interdisciplinary Treatment and Diagnostic Plan Update  09/10/2024 Time of Session: THIS IS AN UPDATE Timothy Figueroa MRN: [id number]  Principal Diagnosis: Bipolar 1 disorder, severe, current or most recent episode manic, with psychotic features (HCC)  Secondary Diagnoses: Principal Problem:   Bipolar 1 disorder, severe, current or most recent episode manic, with psychotic features (HCC) Active Problems:   Severe stimulant use disorder (HCC)   History of alcohol abuse   Current Medications:  Current Facility-Administered Medications  Medication Dose Route Frequency Provider Last Rate Last Admin   haloperidol  (HALDOL ) tablet 5 mg  5 mg Oral TID PRN Onuoha, Chinwendu V, NP       And   diphenhydrAMINE  (BENADRYL ) capsule 50 mg  50 mg Oral TID PRN Onuoha, Chinwendu V, NP       haloperidol  lactate (HALDOL ) injection 5 mg  5 mg Intramuscular TID PRN Onuoha, Chinwendu V, NP       And   diphenhydrAMINE  (BENADRYL ) injection 50 mg  50 mg Intramuscular TID PRN Onuoha, Chinwendu V, NP       And   LORazepam  (ATIVAN ) injection 2 mg  2 mg Intramuscular TID PRN Onuoha, Chinwendu V, NP       haloperidol  lactate (HALDOL ) injection 10 mg  10 mg Intramuscular TID PRN Onuoha, Chinwendu V, NP       And   diphenhydrAMINE  (BENADRYL ) injection 50 mg  50 mg Intramuscular TID PRN Onuoha, Chinwendu V, NP       And   LORazepam  (ATIVAN ) injection 2 mg  2 mg Intramuscular TID PRN Onuoha, Chinwendu V, NP       hydrOXYzine  (ATARAX ) tablet 50 mg  50 mg Oral TID PRN Butler, Laura N, MD   50 mg at 09/09/24 2039   nicotine polacrilex (NICORETTE) gum 2 mg  2 mg Oral PRN Prentis Kitchens A, DO   2 mg at 09/05/24 1829   QUEtiapine (SEROQUEL) tablet 50 mg  50 mg Oral QHS PRN Butler, Laura N, MD   50 mg at 09/07/24 2027   risperiDONE (RISPERDAL) tablet 4 mg  4 mg Oral QHS Pashayan, Alexander S, DO   4 mg at 09/09/24 2039   PTA Medications: Medications Prior to Admission  Medication Sig Dispense Refill Last Dose/Taking    benztropine (COGENTIN) 1 MG tablet Take 1 mg by mouth 2 (two) times daily. (Patient not taking: Reported on 09/03/2024)      hydrOXYzine  (VISTARIL ) 25 MG capsule Take 25 mg by mouth every 8 (eight) hours as needed for anxiety.      risperiDONE (RISPERDAL) 3 MG tablet Take 3 mg by mouth at bedtime. (Patient not taking: Reported on 09/03/2024)      topiramate (TOPAMAX) 25 MG tablet Take 25 mg by mouth 2 (two) times daily. (Patient not taking: Reported on 09/03/2024)      traZODone  (DESYREL ) 50 MG tablet Take 50 mg by mouth at bedtime. (Patient not taking: Reported on 09/03/2024)       Patient Stressors: Marital or family conflict   Medication change or noncompliance   Substance abuse    Patient Strengths: General fund of knowledge  Motivation for treatment/growth   Treatment Modalities: Medication Management, Group therapy, Case management,  1 to 1 session with clinician, Psychoeducation, Recreational therapy.   Physician Treatment Plan for Primary Diagnosis: Bipolar 1 disorder, severe, current or most recent episode manic, with psychotic features (HCC) Long Term Goal(s):     Short Term Goals:    Medication Management: Evaluate patient's response, side effects,  and tolerance of medication regimen.  Therapeutic Interventions: 1 to 1 sessions, Unit Group sessions and Medication administration.  Evaluation of Outcomes: Progressing  Physician Treatment Plan for Secondary Diagnosis: Principal Problem:   Bipolar 1 disorder, severe, current or most recent episode manic, with psychotic features (HCC) Active Problems:   Severe stimulant use disorder (HCC)   History of alcohol abuse  Long Term Goal(s):     Short Term Goals:       Medication Management: Evaluate patient's response, side effects, and tolerance of medication regimen.  Therapeutic Interventions: 1 to 1 sessions, Unit Group sessions and Medication administration.  Evaluation of Outcomes: Progressing   RN Treatment Plan  for Primary Diagnosis: Bipolar 1 disorder, severe, current or most recent episode manic, with psychotic features (HCC) Long Term Goal(s): Knowledge of disease and therapeutic regimen to maintain health will improve  Short Term Goals: Ability to verbalize frustration and anger appropriately will improve, Ability to identify and develop effective coping behaviors will improve, and Compliance with prescribed medications will improve  Medication Management: RN will administer medications as ordered by provider, will assess and evaluate patient's response and provide education to patient for prescribed medication. RN will report any adverse and/or side effects to prescribing provider.  Therapeutic Interventions: 1 on 1 counseling sessions, Psychoeducation, Medication administration, Evaluate responses to treatment, Monitor vital signs and CBGs as ordered, Perform/monitor CIWA, COWS, AIMS and Fall Risk screenings as ordered, Perform wound care treatments as ordered.  Evaluation of Outcomes: Progressing   LCSW Treatment Plan for Primary Diagnosis: Bipolar 1 disorder, severe, current or most recent episode manic, with psychotic features (HCC) Long Term Goal(s): Safe transition to appropriate next level of care at discharge, Engage patient in therapeutic group addressing interpersonal concerns.  Short Term Goals: Engage patient in aftercare planning with referrals and resources, Increase emotional regulation, Identify triggers associated with mental health/substance abuse issues, and Increase skills for wellness and recovery  Therapeutic Interventions: Assess for all discharge needs, 1 to 1 time with Social worker, Explore available resources and support systems, Assess for adequacy in community support network, Educate family and significant other(s) on suicide prevention, Complete Psychosocial Assessment, Interpersonal group therapy.  Evaluation of Outcomes: Progressing   Progress in  Treatment: Attending groups: Yes. some. Participating in groups: Yes. Taking medication as prescribed: Yes. Toleration medication: Yes. Family/Significant other contact made: Yes, individual(s) contacted:  Jameir Ake (dad) [phone] Patient understands diagnosis: No. Discussing patient identified problems/goals with staff: No. Medical problems stabilized or resolved: Yes. Denies suicidal/homicidal ideation: Yes. Issues/concerns per patient self-inventory: No.   New problem(s) identified:  No   New Short Term/Long Term Goal(s):     medication stabilization, elimination of SI thoughts, development of comprehensive mental wellness plan.      Patient Goals:  Patient was unable to participate in the treatment team meeting due to somnolence.   Discharge Plan or Barriers:  Patient recently admitted. CSW will continue to follow and assess for appropriate referrals and possible discharge planning.      Reason for Continuation of Hospitalization: Medication stabilization Withdrawal symptoms   Estimated Length of Stay:  3 - 4 days  Last 3 [place] Suicide Severity Risk Score: Flowsheet Row Ad[facility] (Current) [facility] from 09/03/2024 in [facility] ED from 07/20/2024 in [facility] Emergency Department at [facility]  C-SSRS RISK CATEGORY No Risk No Risk No Risk    Last [facility] 2/9 Scores:  No data to display          Scribe for Treatment Team: Derick JONELLE Blanch, LCSW 09/10/2024 11:58 AM

## 2024-09-10 NOTE — Group Note (Addendum)
 Recreation Therapy Group Note   Group Topic:Coping Skills  Group Date: 09/10/2024 Start Time: 1045 End Time: 1108 Facilitators: Vernel Langenderfer-McCall, LRT,CTRS Location: 500 Hall Dayroom   Group Topic: Coping Skills   Goal Area(s) Addresses: Patient will define what a coping skill is. Patient will work with peer to create a list of healthy coping skills beginning with each letter of the alphabet. Patient will successfully identify positive coping skills they can use post d/c.  Patient will acknowledge benefit(s) of using learned coping skills post d/c.    Behavioral Response: Active   Intervention: Group work   Activity: Coping A to Z. Patient asked to identify what a coping skill is and when they use them. Patients with clinical research associate discussed healthy versus unhealthy coping skills. Next patients were given a blank worksheet titled Coping Skills A-Z and asked to pair up with a peer. Partners were instructed to come up with at least one positive coping skill per letter of the alphabet, addressing a specific challenge (ex: stress, anger, anxiety, depression, grief, doubt, isolation, self-harm/suicidal thoughts, substance use). Patients were given 15 minutes to brainstorm with their peer, before ideas were presented to the large group. Patients and LRT debriefed on the importance of coping skill selection based on situation and back-up plans when a skill tried is not effective. At the end of group, patients were given an handout of alphabetized strategies to keep for future reference.   Education: Pharmacologist, Scientist, Physiological, Discharge Planning.    Education Outcome: Acknowledges education/Verbalizes understanding/In group clarification offered/Additional education needed   Affect/Mood: Appropriate   Participation Level: Active   Participation Quality: Independent   Behavior: Cooperative   Speech/Thought Process: Flight of ideas   Insight: Limited   Judgement: Limited    Modes of Intervention: Group work   Patient Response to Interventions:  Receptive   Education Outcome:  In group clarification offered    Clinical Observations/Individualized Feedback: Pt was bright and would offer suggestions for coping skills. Pt would also come up with random coping skills such as digging a hole. Pt was able to be redirected if he went too far off topic. Pt did identify music as his main coping skill. Overall, pt was involved in activity with peers.     Plan: Continue to engage patient in RT group sessions 2-3x/week.   Krystelle Prashad-McCall, LRT,CTRS 09/10/2024 1:30 PM

## 2024-09-10 NOTE — Progress Notes (Signed)
 Patient had 2 virtual interviews with inpatient dual diagnosis treatment programs:   - [facility] - [place]   - [facility] - [place]  Collateral contact - JD [place], [phone] [place], [employer]  JD stated that patient wants to go to [facility], [place], [address], [place] [address], [phone].  Psychiatric medication management and therapy will be included as part of the program.   Norleen Blumenthal - transporter - [phone] left a voicemail that he is scheduled to land at 9:19 AM, and is expected to arrive at the hospital around 9:45 AM.   Timothy Figueroa, LCSWA 09/10/2024

## 2024-09-10 NOTE — Plan of Care (Signed)
   Problem: Education: Goal: Knowledge of [facility] Education information/materials will improve Outcome: Progressing Goal: Emotional status will improve Outcome: Progressing Goal: Mental status will improve Outcome: Progressing Goal: Verbalization of understanding the information provided will improve Outcome: Progressing

## 2024-09-10 NOTE — Progress Notes (Signed)
(  Sleep Hours) -9  (Any PRNs that were needed, meds refused, or side effects to meds)-Vistaril  50 mg   (Any disturbances and when (visitation, over night)- none  (Concerns raised by the patient)- worried sister  not going to bring his phone tonight  (SI/HI/AVH)- denies

## 2024-09-10 NOTE — Plan of Care (Signed)
   Problem: Education: Goal: Emotional status will improve Outcome: Progressing Goal: Mental status will improve Outcome: Progressing   Problem: Activity: Goal: Interest or engagement in activities will improve Outcome: Progressing Goal: Sleeping patterns will improve Outcome: Progressing

## 2024-09-10 NOTE — Progress Notes (Signed)
 [facility] MD Progress Note  09/10/2024 1:08 PM ERCOLE GEORG  MRN:  [id number] Subjective:   CHARLIES RAYBURN is a 28 yr old male who presented on 11/12 to [facility] under IVC due to Hallucinations and paranoia, and not tending to personal care, he was admitted to [facility] on 11/13.  PPHx is significant for Bipolar Disorder and Polysubstance Abuse (Meth, Cocaine, EtOH), and 3 Prior Psychiatric Hospitalizations (last- [facility] 06/2024).  Case was discussed in the multidisciplinary team. MAR was reviewed and patient was not compliant with his Lithium .  Yesterday he received PRN Hydroxyzine .   Psychiatric Team made the following recommendations yesterday: -Increase Risperdal  to 4 mg QHS for mood stability and psychosis  -Stop Lithium     On interview today patient reports he slept good last night.  He reports his appetite is doing good.  He reports no SI, HI, or AVH.  He reports no overt Paranoia or Ideas of Reference.  He does report he thinks that the [employer] may be following him but is not sure and just wanted to mention it.  He reports mild issues with his medications, he reports being tired this morning.  He asks when he will be discharged.  Discussed that Social Work is working on education officer, museum at [employer] and he was agreeable.  He reports no other concerns at present.   Principal Problem: Bipolar 1 disorder, severe, current or most recent episode manic, with psychotic features (HCC) Diagnosis: Principal Problem:   Bipolar 1 disorder, severe, current or most recent episode manic, with psychotic features (HCC) Active Problems:   Severe stimulant use disorder (HCC)   History of alcohol abuse  Total Time spent with patient:  I personally spent 35 minutes on the unit in direct patient care. The direct patient care time included face-to-face time with the patient, reviewing the patient's chart, communicating with other professionals, and coordinating care.    Past Psychiatric History:   Bipolar Disorder and Polysubstance Abuse (Meth, Cocaine, EtOH), and 3 Prior Psychiatric Hospitalizations (last- [facility] 06/2024).  Past Medical History:  Past Medical History:  Diagnosis Date   Allergy    Asthma    History reviewed. No pertinent surgical history. Family History:  Family History  Problem Relation Age of Onset   Healthy Mother    Hypertension Father    Cancer Paternal Grandfather    Family Psychiatric  History:  Reports None  Social History:  Social History   Substance and Sexual Activity  Alcohol Use Yes   Comment: 1/2 vodka past 2 days     Social History   Substance and Sexual Activity  Drug Use Yes   Types: Marijuana, Amphetamines, Cocaine, Methamphetamines   Comment: daily    Social History   Socioeconomic History   Marital status: Single    Spouse name: Not on file   Number of children: Not on file   Years of education: Not on file   Highest education level: Not on file  Occupational History   Not on file  Tobacco Use   Smoking status: Every Day    Types: Cigarettes   Smokeless tobacco: Current    Types: Chew  Vaping Use   Vaping status: Never Used  Substance and Sexual Activity   Alcohol use: Yes    Comment: 1/2 vodka past 2 days   Drug use: Yes    Types: Marijuana, Amphetamines, Cocaine, Methamphetamines    Comment: daily   Sexual activity: Not on file  Other Topics Concern  Not on file  Social History Narrative   Not on file   Social Drivers of Health   Financial Resource Strain: Not on file  Food Insecurity: No Food Insecurity (09/04/2024)   Hunger Vital Sign    Worried About Running Out of Food in the Last Year: Never true    Ran Out of Food in the Last Year: Never true  Transportation Needs: No Transportation Needs (09/04/2024)   PRAPARE - Administrator, Civil Service (Medical): No    Lack of Transportation (Non-Medical): No  Physical Activity: Not on file  Stress: Not on file  Social Connections:  Not on file   Additional Social History:                         Sleep: Good Estimated Sleeping Duration (Last 24 Hours): 6.00-7.25 hours (Due to Daylight Saving Time, the durations displayed may not accurately represent documentation during the time change interval)  Appetite:  Good  Current Medications: Current Facility-Administered Medications  Medication Dose Route Frequency Provider Last Rate Last Admin   haloperidol  (HALDOL ) tablet 5 mg  5 mg Oral TID PRN Onuoha, Chinwendu V, NP       And   diphenhydrAMINE  (BENADRYL ) capsule 50 mg  50 mg Oral TID PRN Onuoha, Chinwendu V, NP       haloperidol  lactate (HALDOL ) injection 5 mg  5 mg Intramuscular TID PRN Onuoha, Chinwendu V, NP       And   diphenhydrAMINE  (BENADRYL ) injection 50 mg  50 mg Intramuscular TID PRN Onuoha, Chinwendu V, NP       And   LORazepam  (ATIVAN ) injection 2 mg  2 mg Intramuscular TID PRN Onuoha, Chinwendu V, NP       haloperidol  lactate (HALDOL ) injection 10 mg  10 mg Intramuscular TID PRN Onuoha, Chinwendu V, NP       And   diphenhydrAMINE  (BENADRYL ) injection 50 mg  50 mg Intramuscular TID PRN Onuoha, Chinwendu V, NP       And   LORazepam  (ATIVAN ) injection 2 mg  2 mg Intramuscular TID PRN Onuoha, Chinwendu V, NP       hydrOXYzine  (ATARAX ) tablet 50 mg  50 mg Oral TID PRN Butler, Laura N, MD   50 mg at 09/09/24 2039   nicotine polacrilex (NICORETTE) gum 2 mg  2 mg Oral PRN Prentis Kitchens A, DO   2 mg at 09/05/24 1829   QUEtiapine (SEROQUEL) tablet 50 mg  50 mg Oral QHS PRN Towana Leita SAILOR, MD   50 mg at 09/07/24 2027   risperiDONE (RISPERDAL) tablet 4 mg  4 mg Oral QHS Alpa Salvo S, DO   4 mg at 09/09/24 2039    Lab Results: No results found for this or any previous visit (from the past 48 hours).  Blood Alcohol level:  Lab Results  Component Value Date   [facility] <15 07/20/2024   ETH <15 04/14/2024    Metabolic Disorder Labs: Lab Results  Component Value Date   HGBA1C 4.8 09/05/2024    MPG 91.06 09/05/2024   No results found for: PROLACTIN Lab Results  Component Value Date   CHOL 131 04/14/2024   TRIG 109 04/14/2024   HDL 52 04/14/2024   CHOLHDL 2.5 04/14/2024   VLDL 22 04/14/2024   LDLCALC 57 04/14/2024    Physical Findings: AIMS:  ,  ,  ,  ,  ,  ,   CIWA:    COWS:  Musculoskeletal: Strength & Muscle Tone: within normal limits Gait & Station: laying in bed Patient leans: N/A  Psychiatric Specialty Exam:  Presentation  General Appearance:  Casual  Eye Contact: Fair  Speech: Clear and Coherent; Normal Rate  Speech Volume: Normal  Handedness: Right   Mood and Affect  Mood: -- (ok)  Affect: Congruent   Thought Process  Thought Processes: Goal Directed; Linear  Descriptions of Associations:Intact  Orientation:Full (Time, Place and Person)  Thought Content:WDL  History of Schizophrenia/Schizoaffective disorder:No  Duration of Psychotic Symptoms:Less than six months  Hallucinations:Hallucinations: None  Ideas of Reference:None  Suicidal Thoughts:Suicidal Thoughts: No  Homicidal Thoughts:Homicidal Thoughts: No   Sensorium  Memory: Immediate Fair  Judgment: Intact  Insight: Present   Executive Functions  Concentration: Fair  Attention Span: Fair  Recall: Fair  Fund of Knowledge: Fair  Language: Fair   Psychomotor Activity  Psychomotor Activity:Psychomotor Activity: Normal   Assets  Assets: Resilience   Sleep  Sleep:Sleep: Good    Physical Exam: Physical Exam Vitals and nursing note reviewed.  Constitutional:      General: He is not in acute distress.    Appearance: Normal appearance. He is normal weight. He is not ill-appearing or toxic-appearing.  HENT:     Head: Normocephalic and atraumatic.  Pulmonary:     Effort: Pulmonary effort is normal.  Musculoskeletal:        General: Normal range of motion.  Neurological:     General: No focal deficit present.     Mental  Status: He is alert.    Review of Systems  Respiratory:  Negative for cough and shortness of breath.   Cardiovascular:  Negative for chest pain.  Gastrointestinal:  Negative for abdominal pain, constipation, diarrhea, nausea and vomiting.  Neurological:  Negative for dizziness, weakness and headaches.  Psychiatric/Behavioral:  Negative for depression, hallucinations and suicidal ideas. The patient is not nervous/anxious.    Blood pressure 123/81, pulse 95, temperature 97.7 F (36.5 C), temperature source Oral, resp. rate 18, height 6' (1.829 m), weight 93.4 kg, SpO2 99%. Body mass index is 27.94 kg/m.   Treatment Plan Summary: Daily contact with patient to assess and evaluate symptoms and progress in treatment and Medication management  KANYON SEIBOLD is a 28 yr old male who presented on 11/12 to [facility] under IVC due to Hallucinations and paranoia, and not tending to personal care, he was admitted to [facility] on 11/13.  PPHx is significant for Bipolar Disorder and Polysubstance Abuse (Meth, Cocaine, EtOH), and 3 Prior Psychiatric Hospitalizations (last- [facility] 06/2024).   Oluwanifemi seems to be responding well to the Risperdal as he is only reporting mild paranoia that seems to be non distressing.  He declined the LAI.  He appears to be approaching baseline so we will begin searching for Residential Rehab bed placement.  We will not make any changes to his medications at this time.  We will continue to monitor.    Bipolar Disorder, Current Episode Manic, w/Psychotic Features: -Continue Risperdal 4 mg QHS for mood stability and psychosis  -Continue Seroquel 50 mg QHS PRN insomnia and mania -Continue Agitation Protocol: Haldol /Ativan /Benadryl    Stimulant use disorder, severe, dependance (meth and cocaine)  EtOH Use Disorder, unspecified:  -Continue to discuss Residential Rehab   Nicotine Dependence: -Continue Nicotine Gum 2 mg PRN   -Continue PRN's: Atarax     Marsa GORMAN Rosser,  DO 09/10/2024, 1:08 PM

## 2024-09-10 NOTE — BHH Group Notes (Signed)
 Adult Psychoeducational Group Note  Date:  09/10/2024 Time:  11:28 AM  Group Topic/Focus:  Goals Group:   The focus of this group is to help patients establish daily goals to achieve during treatment and discuss how the patient can incorporate goal setting into their daily lives to aide in recovery. Orientation:   The focus of this group is to educate the patient on the purpose and policies of crisis stabilization and provide a format to answer questions about their admission.  The group details unit policies and expectations of patients while admitted.  Participation Level:  Active  Participation Quality:  Appropriate  Affect:  Appropriate  Cognitive:  Appropriate  Insight: Appropriate  Engagement in Group:  Engaged  Modes of Intervention:  Discussion  Additional Comments:  Pt attended the goals group and remained appropriate and engaged throughout the duration of the group.   [name] O 09/10/2024, 11:28 AM

## 2024-09-11 MED ORDER — RISPERIDONE 3 MG PO TABS: 3.0000 mg | ORAL_TABLET | Freq: Every day | ORAL | 0 refills | Status: AC

## 2024-09-11 MED ORDER — HYDROXYZINE HCL 50 MG PO TABS: 50.0000 mg | ORAL_TABLET | Freq: Three times a day (TID) | ORAL | 0 refills | Status: AC | PRN

## 2024-09-11 MED ORDER — NICOTINE POLACRILEX 2 MG MT GUM: 2.0000 mg | CHEWING_GUM | OROMUCOSAL | 0 refills | Status: AC | PRN

## 2024-09-11 NOTE — Progress Notes (Addendum)
  [facility] Adult Case Management Discharge Plan :  Will you be returning to the same living situation after discharge:  No. At discharge, do you have transportation home?: Yes,  Norleen Blumenthal - transporter from [facility]- [phone] will pick him up at 9:45 AM  Do you have the ability to pay for your medications: Yes, patient has insurance  Release of information consent forms completed and in the chart;  Patient's signature needed at discharge.  Patient to Follow up at:  Follow-up Information     [facility] Go on 09/23/2024.   Why: You have an appointment for medication management services on 09/23/24 at 3:00 pm, in person with Lauraine Barks. Contact information: [address] [place] [address] [phone]         [facility] Follow up on 10/10/2024.   Why: You have an appointment for therapy services on 10/10/24 at 9:00 am with Juliene.  This will be a Virtual appointment, via your MyChart account. Contact information: [address] [place] [address] [phone]         [facility]. Go on 09/11/2024.   Why: Please go to this provider for residential treatment on 09/11/24 at 9:45 am for psychiatric medication management and therapy.  This will be included as part of the program. Contact information: [address] [facility] [place], [place] [address]  P: [phone]                Next level of care provider has access to [facility]:no  Safety Planning and Suicide Prevention discussed: Yes,  with patient and Ali Mohl (dad) [phone]   Has patient been referred to the Quitline?: Patient refused referral for treatment  Patient has been referred for addiction treatment:  Patient will transfer directly from the hospital to [facility], [place], [address], [place] [address], [phone] (inpatient dual diagnosis, mental health + substance use).   Rykar Lebleu O Endia Moncur,  LCSWA 09/11/2024, 9:14 AM

## 2024-09-11 NOTE — Progress Notes (Signed)
 Upon discharging, Nurse went over AVS educating on medications and follow up appointments. Suicide safety plan was completed. Reviewed with nurse and copy given to patient and placed in chart. Belongings sheet was not signed but items returned to patient. Patient denies SI/HI (with no plan) and AVH. Voices no acute concerns to staff prior discharging off unit. Was safely walked out ride.

## 2024-09-11 NOTE — [facility] Suicide Risk Assessment (Signed)
 [facility] Discharge Suicide Risk Assessment   Principal Problem: Bipolar 1 disorder, severe, current or most recent episode manic, with psychotic features [facility]) Discharge Diagnoses: Principal Problem:   Bipolar 1 disorder, severe, current or most recent episode manic, with psychotic features (HCC) Active Problems:   Severe stimulant use disorder (HCC)   History of alcohol abuse  During the patient's hospitalization, patient had extensive initial psychiatric evaluation, and follow-up psychiatric evaluations every day.  Psychiatric diagnoses provided upon initial assessment:  Bipolar 1 disorder, severe, current or most recent episode manic, with psychotic features (HCC) Active Problems:   Severe stimulant use disorder (HCC)   History of alcohol abuse  Patient's psychiatric medications were adjusted on admission: Started Risperdal.  During the hospitalization, other adjustments were made to the patient's psychiatric medication regimen: Risperdal was titrated.  Lithium was started but then stopped due to patient request as he did not want to have the potential side effects.  Gradually, patient started adjusting to milieu.   Patient's care was discussed during the interdisciplinary team meeting every day during the hospitalization.  The patient is not having side effects to prescribed psychiatric medication except mild tiredness.  The patient reports their target psychiatric symptoms of mania/bizarre behavior responded well to the psychiatric medications, and the patient reports overall benefit other psychiatric hospitalization. Supportive psychotherapy was provided to the patient. The patient also participated in regular group therapy while admitted.   Labs were reviewed with the patient, and abnormal results were discussed with the patient.  The patient denied having suicidal thoughts more than 48 hours prior to discharge.  Patient denies having homicidal thoughts.  Patient denies having  auditory hallucinations.  Patient denies any visual hallucinations.  Patient denies having paranoid thoughts.  The patient is able to verbalize their individual safety plan to this provider.  It is recommended to the patient to continue psychiatric medications as prescribed, after discharge from the hospital.    It is recommended to the patient to follow up with your outpatient psychiatric provider and PCP.  Discussed with the patient, the impact of alcohol, drugs, tobacco have been there overall psychiatric and medical wellbeing, and total abstinence from substance use was recommended the patient.  Total Time spent with patient: 20 minutes  Musculoskeletal: Strength & Muscle Tone: within normal limits Gait & Station: normal Patient leans: N/A  Psychiatric Specialty Exam  Presentation  General Appearance:  Appropriate for Environment; Casual  Eye Contact: Good  Speech: Clear and Coherent; Normal Rate  Speech Volume: Normal  Handedness: Right   Mood and Affect  Mood: -- (ok)  Duration of Depression Symptoms: Greater than two weeks  Affect: Congruent; Appropriate   Thought Process  Thought Processes: Coherent; Goal Directed  Descriptions of Associations:Intact  Orientation:Full (Time, Place and Person)  Thought Content:Logical; WDL  History of Schizophrenia/Schizoaffective disorder:No  Duration of Psychotic Symptoms:Less than six months  Hallucinations:Hallucinations: None  Ideas of Reference:None  Suicidal Thoughts:Suicidal Thoughts: No  Homicidal Thoughts:Homicidal Thoughts: No   Sensorium  Memory: Immediate Fair  Judgment: Fair  Insight: Fair   Art Therapist  Concentration: Good  Attention Span: Good  Recall: Good  Fund of Knowledge: Good  Language: Good   Psychomotor Activity  Psychomotor Activity: Psychomotor Activity: Normal   Assets  Assets: Resilience   Sleep  Sleep: Sleep: Good  Estimated  Sleeping Duration (Last 24 Hours): 8.50-9.50 hours (Due to Daylight Saving Time, the durations displayed may not accurately represent documentation during the time change interval)  Physical Exam: Physical Exam  Vitals and nursing note reviewed.  Constitutional:      General: He is not in acute distress.    Appearance: Normal appearance. He is normal weight. He is not ill-appearing or toxic-appearing.  HENT:     Head: Normocephalic and atraumatic.  Pulmonary:     Effort: Pulmonary effort is normal.  Musculoskeletal:        General: Normal range of motion.  Neurological:     General: No focal deficit present.     Mental Status: He is alert.    Review of Systems  Respiratory:  Negative for cough and shortness of breath.   Cardiovascular:  Negative for chest pain.  Gastrointestinal:  Negative for abdominal pain, constipation, diarrhea, nausea and vomiting.  Neurological:  Negative for dizziness, weakness and headaches.  Psychiatric/Behavioral:  Negative for depression, hallucinations and suicidal ideas. The patient is not nervous/anxious.    Blood pressure 118/81, pulse 81, temperature 97.7 F (36.5 C), temperature source Oral, resp. rate 16, height 6' (1.829 m), weight 93.4 kg, SpO2 99%. Body mass index is 27.94 kg/m.  Mental Status Per Nursing Assessment::   On Admission:  NA  Demographic Factors:  Male and Caucasian  Loss Factors: NA  Historical Factors: NA  Risk Reduction Factors:   NA  Continued Clinical Symptoms:  Alcohol/Substance Abuse/Dependencies Unstable or Poor Therapeutic Relationship  Cognitive Features That Contribute To Risk:  Loss of executive function    Suicide Risk:  Minimal: No identifiable suicidal ideation.  Patients presenting with no risk factors but with morbid ruminations; may be classified as minimal risk based on the severity of the depressive symptoms   Follow-up Information     [facility] Go on 09/23/2024.   Why: You  have an appointment for medication management services on 09/23/24 at 3:00 pm, in person with Lauraine Barks. Contact information: [address]  [place] [address] [phone]         [facility] Follow up on 10/10/2024.   Why: You have an appointment for therapy services on 10/10/24 at 9:00 am with Juliene.  This will be a Virtual appointment, via your MyChart account. Contact information: [address] [place] [address] [phone]         [facility]. Go on 09/11/2024.   Why: Please go to this provider for residential treatment on 09/11/24 at 9:45 am for psychiatric medication management and therapy.  This will be included as part of the program. Contact information: [address] [facility] [place], [place] [address]  P: [phone]                Plan Of Care/Follow-up recommendations:  Activity: as tolerated  Diet: heart healthy  Other: -Follow-up with your outpatient psychiatric provider -instructions on appointment date, time, and address (location) are provided to you in discharge paperwork.  -Take your psychiatric medications as prescribed at discharge - instructions are provided to you in the discharge paperwork  -Follow-up with outpatient primary care doctor and other specialists -for management of chronic medical disease, including: Routine Care.  Continued care for Substance Use.  -Testing: Follow-up with outpatient provider for abnormal lab results: None  -Recommend abstinence from alcohol, tobacco, and other illicit drug use at discharge.   -If your psychiatric symptoms recur, worsen, or if you have side effects to your psychiatric medications, call your outpatient psychiatric provider, 911, [phone] or go to the nearest emergency department.  -If suicidal thoughts recur, call your outpatient psychiatric provider,  [phone] or go to the nearest emergency department.   Marsa GORMAN Rosser,  DO 09/11/2024, 8:08 AM

## 2024-09-11 NOTE — Discharge Summary (Signed)
 Physician Discharge Summary Note  Patient:  Timothy Figueroa is an 28 y.o., male MRN:  [id number] DOB:  29-Jan-1996 Patient phone:  [phone] (home)  Patient address:   [address] [facility] [address],  Total Time spent with patient: 20 minutes  Date of Admission:  09/04/2024 Date of Discharge: 09/11/2024  Reason for Admission:   Timothy Figueroa is a 28 yr old male who presented on 11/12 to [facility] under IVC due to Hallucinations and paranoia, and not tending to personal care, he was admitted to [facility] on 11/13.  PPHx is significant for Bipolar Disorder and Polysubstance Abuse (Meth, Cocaine, EtOH), and 3 Prior Psychiatric Hospitalizations (last- [facility] 06/2024).   Today patient reports that he is not good because he is in the hospital. States that he found out he was being poisoned. When asked by who, says the woman who I thought was my mother but isn't. He reports that about 3 days ago he found out that the queen of [place] is really his mother. He states he is a [place]. When asked about his psych history the patient denies all concerns, although does agree that he has been in the hospital several times this year. He attributes this to family being out to get him in some way and denies any actual need for medications. States all medications have side effects and he does not like them. Went through a list of several options and patient identified risperidone as the one that mostly okay, but still didn't like the way it made me feel. He hated tegretol and other antipsychotics and has no interest in lithium. Pan-denies all other concerns. Requests to leave the hospital and declines medications. Denies SI, HI and AVH.    Patient declines for this author to reach out to family as they are not really my family and I am never going back there.     Principal Problem: Bipolar 1 disorder, severe, current or most recent episode manic, with psychotic features [facility]) Discharge  Diagnoses: Principal Problem:   Bipolar 1 disorder, severe, current or most recent episode manic, with psychotic features (HCC) Active Problems:   Severe stimulant use disorder (HCC)   History of alcohol abuse   Past Psychiatric History:  Bipolar Disorder and Polysubstance Abuse (Meth, Cocaine, EtOH), and 3 Prior Psychiatric Hospitalizations (last- [facility] 06/2024).   Past Medical History:  Past Medical History:  Diagnosis Date   Allergy    Asthma    History reviewed. No pertinent surgical history. Family History:  Family History  Problem Relation Age of Onset   Healthy Mother    Hypertension Father    Cancer Paternal Grandfather    Family Psychiatric  History:  Reports None   Social History:  Social History   Substance and Sexual Activity  Alcohol Use Yes   Comment: 1/2 vodka past 2 days     Social History   Substance and Sexual Activity  Drug Use Yes   Types: Marijuana, Amphetamines, Cocaine, Methamphetamines   Comment: daily    Social History   Socioeconomic History   Marital status: Single    Spouse name: Not on file   Number of children: Not on file   Years of education: Not on file   Highest education level: Not on file  Occupational History   Not on file  Tobacco Use   Smoking status: Every Day    Types: Cigarettes   Smokeless tobacco: Current    Types: Chew  Vaping Use  Vaping status: Never Used  Substance and Sexual Activity   Alcohol use: Yes    Comment: 1/2 vodka past 2 days   Drug use: Yes    Types: Marijuana, Amphetamines, Cocaine, Methamphetamines    Comment: daily   Sexual activity: Not on file  Other Topics Concern   Not on file  Social History Narrative   Not on file   Social Drivers of Health   Financial Resource Strain: Not on file  Food Insecurity: No Food Insecurity (09/04/2024)   Hunger Vital Sign    Worried About Running Out of Food in the Last Year: Never true    Ran Out of Food in the Last Year: Never true   Transportation Needs: No Transportation Needs (09/04/2024)   PRAPARE - Administrator, Civil Service (Medical): No    Lack of Transportation (Non-Medical): No  Physical Activity: Not on file  Stress: Not on file  Social Connections: Not on file    Hospital Course:   During the patient's hospitalization, patient had extensive initial psychiatric evaluation, and follow-up psychiatric evaluations every day.  Psychiatric diagnoses provided upon initial assessment:  Bipolar 1 disorder, severe, current or most recent episode manic, with psychotic features (HCC) Active Problems:   Severe stimulant use disorder (HCC)   History of alcohol abuse    Patient's psychiatric medications were adjusted on admission: Started Risperdal .   During the hospitalization, other adjustments were made to the patient's psychiatric medication regimen: Risperdal  was titrated. Lithium  was started but then stopped due to patient request as he did not want to have the potential side effects.   Patient's care was discussed during the interdisciplinary team meeting every day during the hospitalization.  The patient is not having side effects to prescribed psychiatric medication except mild tiredness.  Gradually, patient started adjusting to milieu. The patient was evaluated each day by a clinical provider to ascertain response to treatment. Improvement was noted by the patient's report of decreasing symptoms, improved sleep and appetite, affect, medication tolerance, behavior, and participation in unit programming.  Patient was asked each day to complete a self inventory noting mood, mental status, pain, new symptoms, anxiety and concerns.   Symptoms were reported as significantly decreased or resolved completely by discharge.  The patient reports that their mood is stable.  The patient denied having suicidal thoughts for more than 48 hours prior to discharge.  Patient denies having homicidal thoughts.   Patient denies having auditory hallucinations.  Patient denies any visual hallucinations or other symptoms of psychosis.  The patient was motivated to continue taking medication with a goal of continued improvement in mental health.   The patient reports their target psychiatric symptoms of mania/bizarre behavior responded well to the psychiatric medications, and the patient reports overall benefit other psychiatric hospitalization. Supportive psychotherapy was provided to the patient. The patient also participated in regular group therapy while hospitalized. Coping skills, problem solving as well as relaxation therapies were also part of the unit programming.  Labs were reviewed with the patient, and abnormal results were discussed with the patient.  The patient is able to verbalize their individual safety plan to this provider.  # It is recommended to the patient to continue psychiatric medications as prescribed, after discharge from the hospital.    # It is recommended to the patient to follow up with your outpatient psychiatric provider and PCP.  # It was discussed with the patient, the impact of alcohol, drugs, tobacco have been there overall psychiatric  and medical wellbeing, and total abstinence from substance use was recommended the patient.ed.  # Prescriptions provided or sent directly to preferred pharmacy at discharge. Patient agreeable to plan. Given opportunity to ask questions. Appears to feel comfortable with discharge.    # In the event of worsening symptoms, the patient is instructed to call the crisis hotline, 911 and or go to the nearest ED for appropriate evaluation and treatment of symptoms. To follow-up with primary care provider for other medical issues, concerns and or health care needs  # Patient was discharged to [place]  [facility] with a plan to follow up as noted below.    On day of discharge he reports he is ready to go.  He is looking forward to going to  [place]  for [facility].  He asks how to avoid being IVC'd in the future and discussed having conversations with his outpatient provider and parents so that everyone can be on the same page and discussed the importance of taking his medications.  He reports he is still having too much tiredness from his Risperdal and requests it be decreased to 3 mg and discussed that we would do this.  He reports no other side effects to his medications.  He reports his sleep is good.  He reports his appetite is good.  He reports no SI, HI, or AVH.  Discussed with him the importance of taking his medications as prescribed and attending his follow up appointments and he reported understanding.  Discussed with him what to do in the event of a future crisis.  Discussed that he can go to [facility], go to the nearest ED, or call 911 or 988.   He reported understanding and had no concerns. He was discharged to [place]  for [facility].   Physical Findings: AIMS: Facial and Oral Movements Muscles of Facial Expression: None Lips and Perioral Area: None Jaw: None Tongue: None,Extremity Movements Upper (arms, wrists, hands, fingers): None Lower (legs, knees, ankles, toes): None, Trunk Movements Neck, shoulders, hips: None, Global Judgements Severity of abnormal movements overall : None Incapacitation due to abnormal movements: None Patient's awareness of abnormal movements: No Awareness,  ,  , AIMS Total Score AIMS Total Score: 0 CIWA:    COWS:     Musculoskeletal: Strength & Muscle Tone: within normal limits Gait & Station: normal Patient leans: N/A   Psychiatric Specialty Exam:  Presentation  General Appearance:  Appropriate for Environment; Casual  Eye Contact: Good  Speech: Clear and Coherent; Normal Rate  Speech Volume: Normal  Handedness: Right   Mood and Affect  Mood: -- (ok)  Affect: Congruent; Appropriate   Thought Process  Thought Processes: Coherent; Goal  Directed  Descriptions of Associations:Intact  Orientation:Full (Time, Place and Person)  Thought Content:Logical; WDL  History of Schizophrenia/Schizoaffective disorder:No  Duration of Psychotic Symptoms:Less than six months  Hallucinations:Hallucinations: None  Ideas of Reference:None  Suicidal Thoughts:Suicidal Thoughts: No  Homicidal Thoughts:Homicidal Thoughts: No   Sensorium  Memory: Immediate Fair  Judgment: Fair  Insight: Fair   Art Therapist  Concentration: Good  Attention Span: Good  Recall: Good  Fund of Knowledge: Good  Language: Good   Psychomotor Activity  Psychomotor Activity: Psychomotor Activity: Normal   Assets  Assets: Resilience   Sleep  Sleep: Sleep: Good  Estimated Sleeping Duration (Last 24 Hours): 8.50-9.50 hours (Due to Daylight Saving Time, the durations displayed may not accurately represent documentation during the time change interval)   Physical Exam: Physical Exam Vitals and nursing note reviewed.  Constitutional:      General: He is not in acute distress.    Appearance: Normal appearance. He is normal weight. He is not ill-appearing or toxic-appearing.  HENT:     Head: Normocephalic and atraumatic.  Pulmonary:     Effort: Pulmonary effort is normal.  Musculoskeletal:        General: Normal range of motion.  Neurological:     General: No focal deficit present.     Mental Status: He is alert.    Review of Systems  Respiratory:  Negative for cough and shortness of breath.   Cardiovascular:  Negative for chest pain.  Gastrointestinal:  Negative for abdominal pain, constipation, diarrhea, nausea and vomiting.  Neurological:  Negative for dizziness, weakness and headaches.  Psychiatric/Behavioral:  Negative for depression, hallucinations and suicidal ideas. The patient is not nervous/anxious.    Blood pressure 118/81, pulse 81, temperature 97.7 F (36.5 C), temperature source Oral, resp. rate  16, height 6' (1.829 m), weight 93.4 kg, SpO2 99%. Body mass index is 27.94 kg/m.   Social History   Tobacco Use  Smoking Status Every Day   Types: Cigarettes  Smokeless Tobacco Current   Types: Chew   Tobacco Cessation:  A prescription for an FDA-approved tobacco cessation medication provided at discharge   Blood Alcohol level:  Lab Results  Component Value Date   [facility] <15 07/20/2024   ETH <15 04/14/2024    Metabolic Disorder Labs:  Lab Results  Component Value Date   HGBA1C 4.8 09/05/2024   MPG 91.06 09/05/2024   No results found for: PROLACTIN Lab Results  Component Value Date   CHOL 131 04/14/2024   TRIG 109 04/14/2024   HDL 52 04/14/2024   CHOLHDL 2.5 04/14/2024   VLDL 22 04/14/2024   LDLCALC 57 04/14/2024    See Psychiatric Specialty Exam and Suicide Risk Assessment completed by Attending Physician prior to discharge.  Discharge destination:  Other:  [place]  [facility]  Is patient on multiple antipsychotic therapies at discharge:  No   Has Patient had three or more failed trials of antipsychotic monotherapy by history:  No  Recommended Plan for Multiple Antipsychotic Therapies: NA  Discharge Instructions     Diet - low sodium heart healthy   Complete by: As directed    Increase activity slowly   Complete by: As directed       Allergies as of 09/11/2024       Reactions   Penicillins Hives   Sulfa Antibiotics Hives        Medication List     STOP taking these medications    benztropine 1 MG tablet Commonly known as: COGENTIN   hydrOXYzine  25 MG capsule Commonly known as: VISTARIL    topiramate 25 MG tablet Commonly known as: TOPAMAX   traZODone  50 MG tablet Commonly known as: DESYREL        TAKE these medications      Indication  hydrOXYzine  50 MG tablet Commonly known as: ATARAX  Take 1 tablet (50 mg total) by mouth 3 (three) times daily as needed for anxiety.  Indication: Feeling Anxious   nicotine  polacrilex  2 MG gum Commonly known as: NICORETTE  Take 1 each (2 mg total) by mouth as needed for smoking cessation.  Indication: Nicotine  Addiction   risperiDONE  3 MG tablet Commonly known as: RISPERDAL  Take 1 tablet (3 mg total) by mouth at bedtime.  Indication: Manic Phase of Manic-Depression        Follow-up Information     [facility] Go on 09/23/2024.   Why: You have an appointment for medication management services on 09/23/24 at 3:00 pm, in person with Lauraine Barks. Contact information: [address] [place] [address] [phone]         [facility] Follow up on 10/10/2024.   Why: You have an appointment for therapy services on 10/10/24 at 9:00 am with Juliene.  This will be a Virtual appointment, via your MyChart account. Contact information: [address] [place] [address] [phone]         [facility]. Go on 09/11/2024.   Why: Please go to this provider for residential treatment on 09/11/24 at 9:45 am for psychiatric medication management and therapy.  This will be included as part of the program. Contact information: [address] [facility] [place], [place] [address]  P: [phone]                Follow-up recommendations/Comments:   Activity: as tolerated   Diet: heart healthy   Other: -Follow-up with your outpatient psychiatric provider -instructions on appointment date, time, and address (location) are provided to you in discharge paperwork.   -Take your psychiatric medications as prescribed at discharge - instructions are provided to you in the discharge paperwork   -Follow-up with outpatient primary care doctor and other specialists -for management of chronic medical disease, including: Routine Care.  Continued care for Substance Use.   -Testing: Follow-up with outpatient provider for abnormal lab results: None   -Recommend abstinence from alcohol, tobacco, and other illicit drug  use at discharge.    -If your psychiatric symptoms recur, worsen, or if you have side effects to your psychiatric medications, call your outpatient psychiatric provider, 911, [phone] or go to the nearest emergency department.   -If suicidal thoughts recur, call your outpatient psychiatric provider, 911, [phone] or go to the nearest emergency department.    Signed: Marsa GORMAN Rosser, DO 09/11/2024, 1:53 PM

## 2024-09-11 NOTE — [facility] Suicide Risk Assessment (Signed)
 [facility] INPATIENT:  Family/Significant Other Suicide Prevention Education  Suicide Prevention Education:  Education Completed; with patient,  (name of family member/significant other) has been identified by the patient as the family member/significant other with whom the patient will be residing, and identified as the person(s) who will aid the patient in the event of a mental health crisis (suicidal ideations/suicide attempt).  With written consent from the patient, the family member/significant other has been provided the following suicide prevention education, prior to the and/or following the discharge of the patient.  Patient was given Suicide Prevention Information brochure and resources.  Patient said he doesn't have guns or weapons, and access to guns or weapons.  The suicide prevention education provided includes the following: Suicide risk factors Suicide prevention and interventions National Suicide Hotline telephone number [facility] assessment telephone number [facility] Emergency Assistance 911 [facility] and/or Residential Mobile Crisis Unit telephone number  Request made of family/significant other to: Remove weapons (e.g., guns, rifles, knives), all items previously/currently identified as safety concern.   Remove drugs/medications (over-the-counter, prescriptions, illicit drugs), all items previously/currently identified as a safety concern.  The family member/significant other verbalizes understanding of the suicide prevention education information provided.  The family member/significant other agrees to remove the items of safety concern listed above.  Maksim Peregoy O Makalia Bare, LCSWA 09/11/2024, 9:26 AM

## 2024-10-10 ENCOUNTER — Ambulatory Visit (HOSPITAL_COMMUNITY): Admitting: Licensed Clinical Social Worker

## 2024-10-10 ENCOUNTER — Encounter (HOSPITAL_COMMUNITY): Payer: Self-pay
